# Patient Record
Sex: Female | Born: 1950 | Race: White | Hispanic: No | Marital: Married | Smoking: Never smoker
Health system: Southern US, Community
[De-identification: ages and names within clinical notes are randomized; demographics above are authoritative.]

## PROBLEM LIST (undated history)

## (undated) DIAGNOSIS — M818 Other osteoporosis without current pathological fracture: Secondary | ICD-10-CM

## (undated) DIAGNOSIS — J189 Pneumonia, unspecified organism: Secondary | ICD-10-CM

## (undated) DIAGNOSIS — K219 Gastro-esophageal reflux disease without esophagitis: Secondary | ICD-10-CM

## (undated) DIAGNOSIS — E1169 Type 2 diabetes mellitus with other specified complication: Secondary | ICD-10-CM

## (undated) DIAGNOSIS — E781 Pure hyperglyceridemia: Secondary | ICD-10-CM

## (undated) DIAGNOSIS — J45909 Unspecified asthma, uncomplicated: Secondary | ICD-10-CM

## (undated) DIAGNOSIS — I10 Essential (primary) hypertension: Secondary | ICD-10-CM

## (undated) DIAGNOSIS — C801 Malignant (primary) neoplasm, unspecified: Secondary | ICD-10-CM

## (undated) DIAGNOSIS — K5790 Diverticulosis of intestine, part unspecified, without perforation or abscess without bleeding: Secondary | ICD-10-CM

## (undated) HISTORY — PX: ESOPHAGOGASTRODUODENOSCOPY: SHX1529

## (undated) HISTORY — PX: TONSILLECTOMY AND ADENOIDECTOMY: SHX28

## (undated) HISTORY — PX: ABDOMINAL HYSTERECTOMY: SHX81

## (undated) HISTORY — PX: TONSILLECTOMY: SUR1361

## (undated) HISTORY — PX: COLONOSCOPY: SHX174

## (undated) HISTORY — PX: KNEE ARTHROSCOPY: SHX127

## (undated) HISTORY — PX: OTHER SURGICAL HISTORY: SHX169

---

## 1978-02-12 HISTORY — PX: BREAST BIOPSY: SHX20

## 1978-02-12 HISTORY — PX: BREAST SURGERY: SHX581

## 2003-11-19 ENCOUNTER — Encounter: Payer: Self-pay | Admitting: Internal Medicine

## 2003-12-14 ENCOUNTER — Encounter: Payer: Self-pay | Admitting: Internal Medicine

## 2004-01-13 ENCOUNTER — Encounter: Payer: Self-pay | Admitting: Internal Medicine

## 2004-02-13 ENCOUNTER — Encounter: Payer: Self-pay | Admitting: Internal Medicine

## 2004-03-08 ENCOUNTER — Ambulatory Visit: Payer: Self-pay

## 2004-03-15 ENCOUNTER — Encounter: Payer: Self-pay | Admitting: Internal Medicine

## 2004-04-12 ENCOUNTER — Encounter: Payer: Self-pay | Admitting: Internal Medicine

## 2004-05-13 ENCOUNTER — Encounter: Payer: Self-pay | Admitting: Internal Medicine

## 2004-06-12 ENCOUNTER — Encounter: Payer: Self-pay | Admitting: Internal Medicine

## 2004-07-03 ENCOUNTER — Ambulatory Visit: Payer: Self-pay | Admitting: Internal Medicine

## 2004-07-13 ENCOUNTER — Encounter: Payer: Self-pay | Admitting: Internal Medicine

## 2004-07-13 ENCOUNTER — Ambulatory Visit: Payer: Self-pay | Admitting: Internal Medicine

## 2004-08-12 ENCOUNTER — Ambulatory Visit: Payer: Self-pay | Admitting: Internal Medicine

## 2004-09-12 ENCOUNTER — Ambulatory Visit: Payer: Self-pay | Admitting: Internal Medicine

## 2004-12-26 ENCOUNTER — Ambulatory Visit: Payer: Self-pay

## 2005-05-30 ENCOUNTER — Inpatient Hospital Stay: Payer: Self-pay | Admitting: Internal Medicine

## 2005-05-30 ENCOUNTER — Other Ambulatory Visit: Payer: Self-pay

## 2005-06-08 ENCOUNTER — Ambulatory Visit: Payer: Self-pay

## 2006-06-11 ENCOUNTER — Ambulatory Visit: Payer: Self-pay

## 2007-06-26 ENCOUNTER — Ambulatory Visit: Payer: Self-pay

## 2007-09-16 ENCOUNTER — Ambulatory Visit: Payer: Self-pay

## 2007-10-01 ENCOUNTER — Ambulatory Visit: Payer: Self-pay | Admitting: Gastroenterology

## 2007-10-10 ENCOUNTER — Ambulatory Visit: Payer: Self-pay | Admitting: Gastroenterology

## 2007-11-12 ENCOUNTER — Ambulatory Visit: Payer: Self-pay | Admitting: Gastroenterology

## 2007-11-24 ENCOUNTER — Ambulatory Visit: Payer: Self-pay | Admitting: Nurse Practitioner

## 2008-01-12 ENCOUNTER — Ambulatory Visit: Payer: Self-pay | Admitting: Gastroenterology

## 2008-05-03 ENCOUNTER — Ambulatory Visit: Payer: Self-pay | Admitting: Cardiology

## 2008-05-11 ENCOUNTER — Ambulatory Visit: Payer: Self-pay | Admitting: Cardiology

## 2008-07-13 ENCOUNTER — Ambulatory Visit: Payer: Self-pay

## 2009-03-09 ENCOUNTER — Ambulatory Visit: Payer: Self-pay | Admitting: Unknown Physician Specialty

## 2009-04-05 IMAGING — US ABDOMEN ULTRASOUND
1 series · 17 of 25 positions shown · non-contrast
Comparison: none

REASON FOR EXAM: Generalized abdominal pain
COMMENTS:

[Series 1: abdomen ultrasound · 17 of 57 slices shown]
[im 1/57]
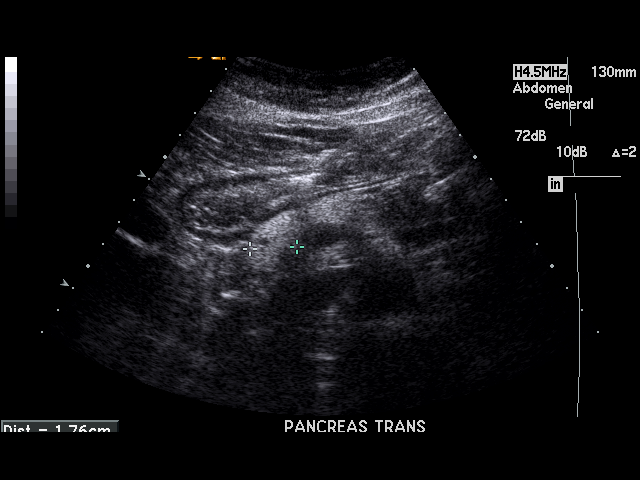
[im 5/57]
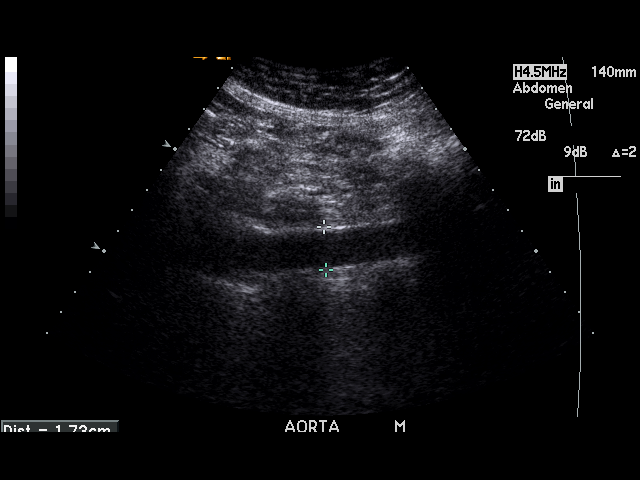
[im 8/57]
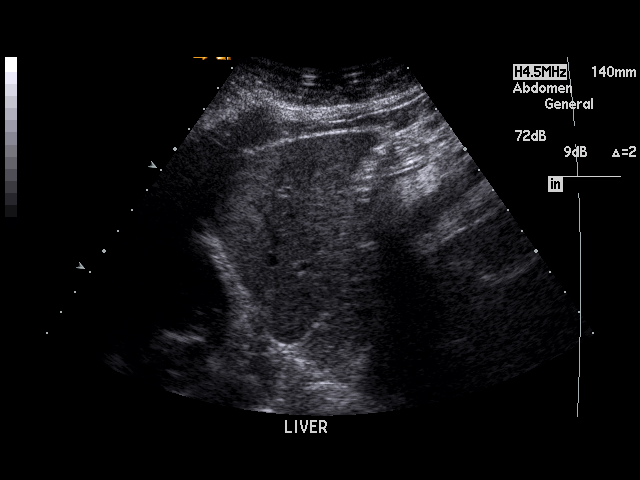
[im 12/57]
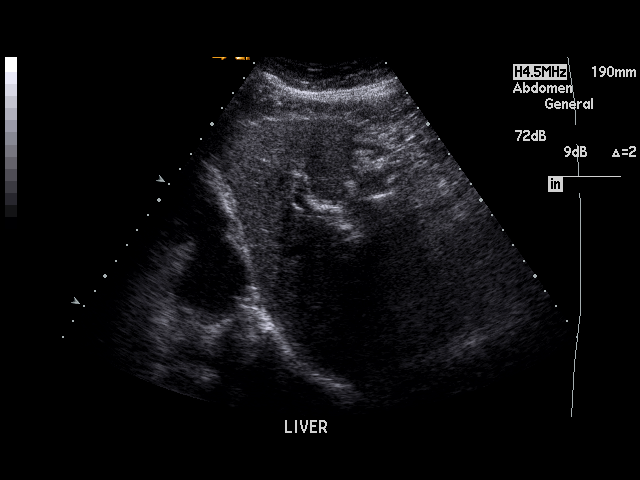
[im 15/57]
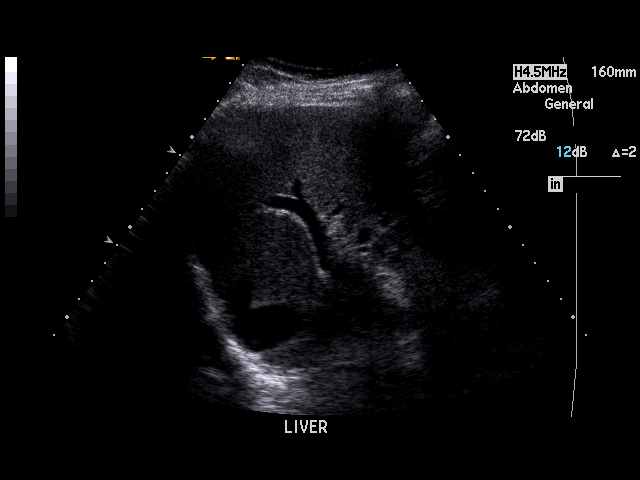
[im 19/57]
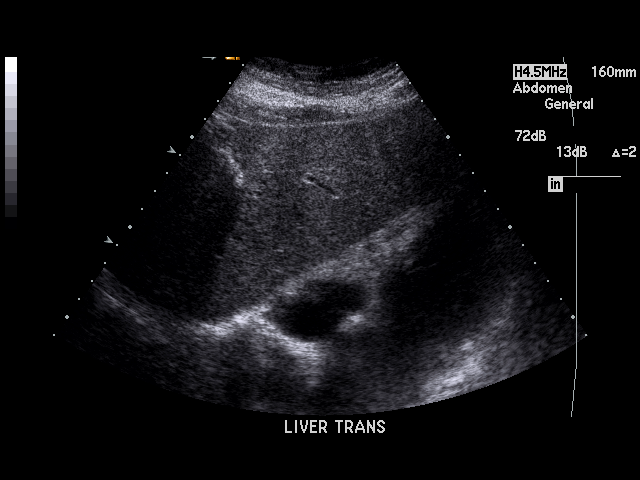
[im 22/57]
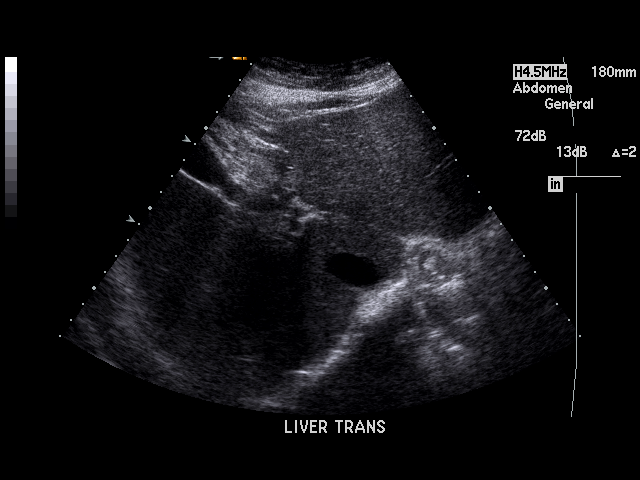
[im 26/57]
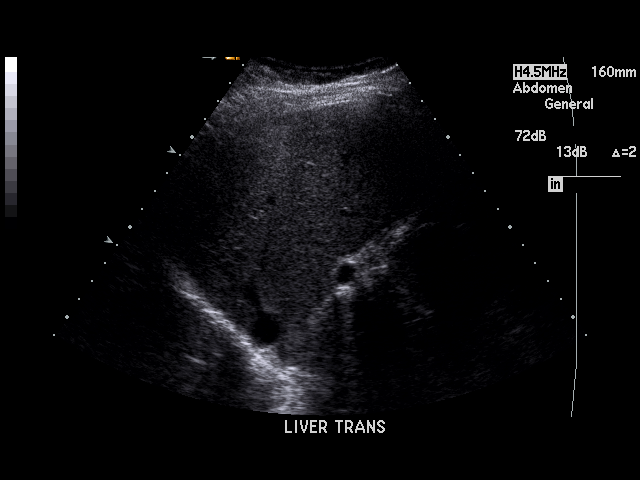
[im 29/57]
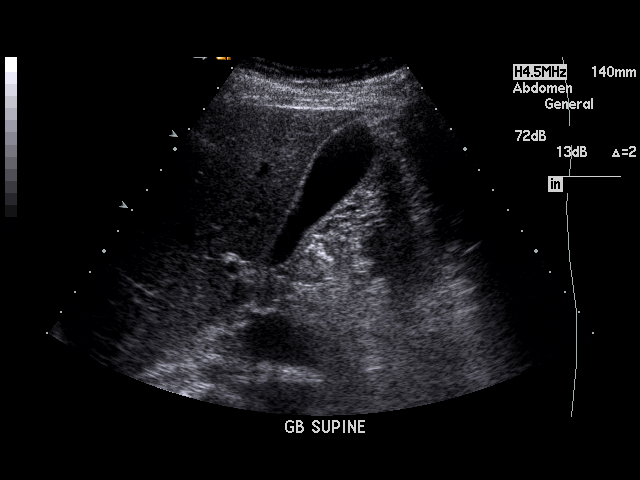
[im 31/57]
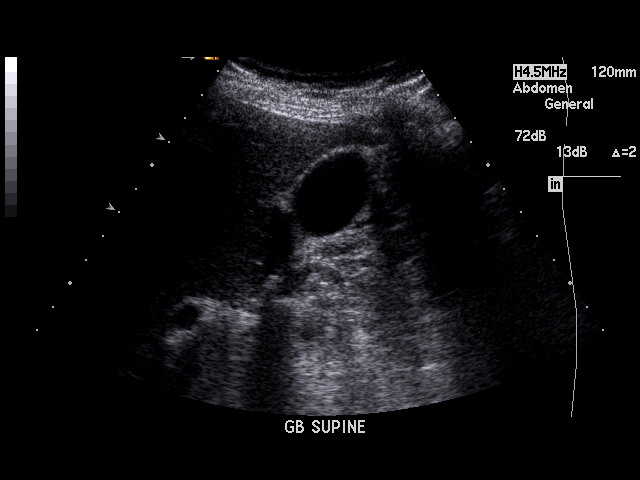
[im 36/57]
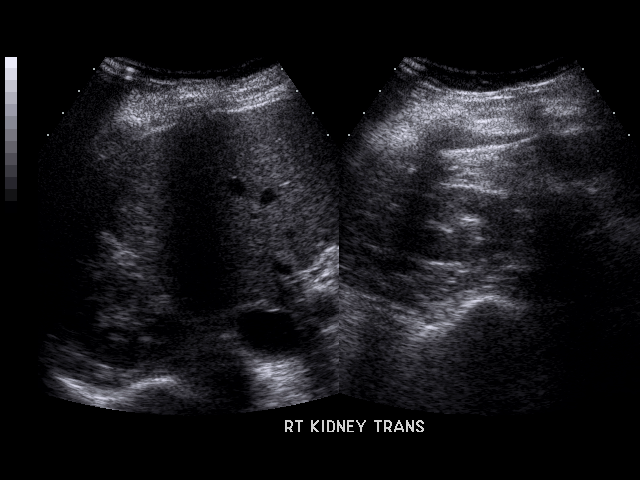
[im 38/57]
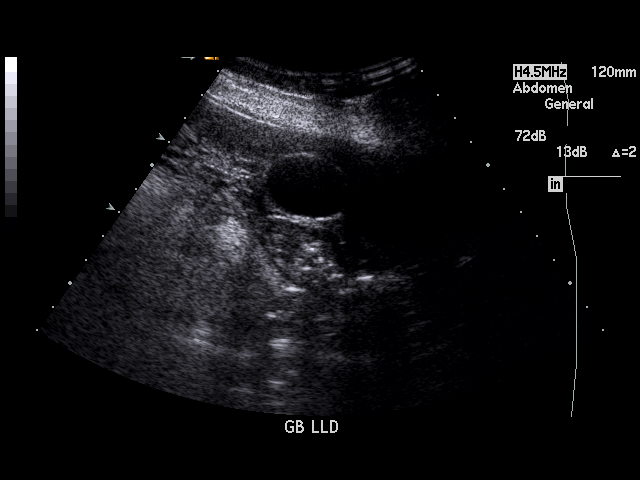
[im 43/57]
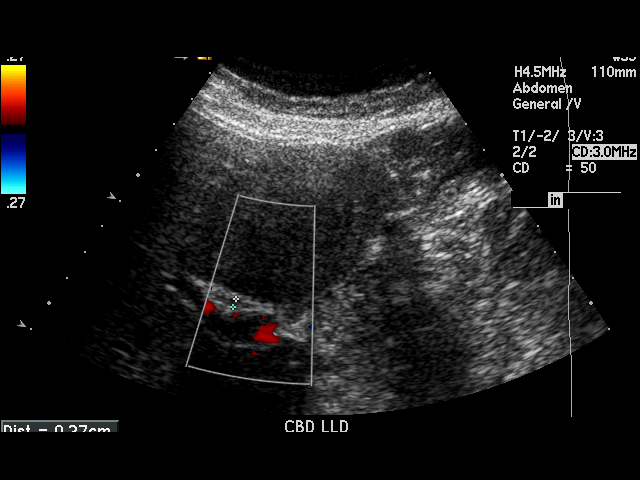
[im 45/57]
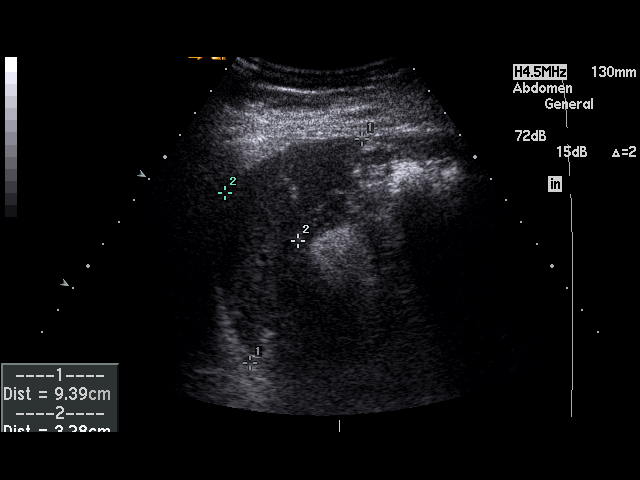
[im 50/57]
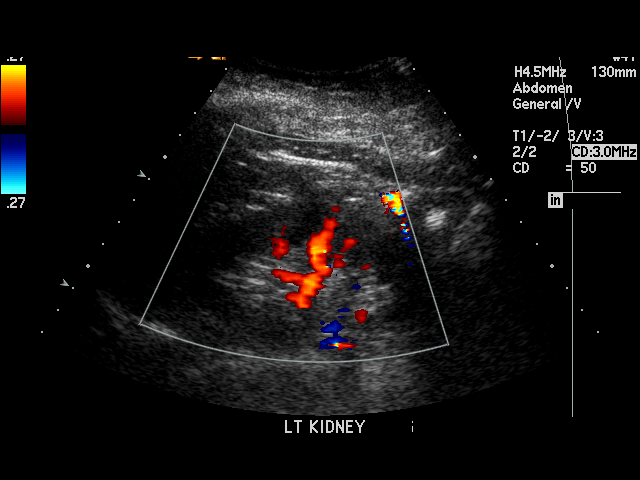
[im 52/57]
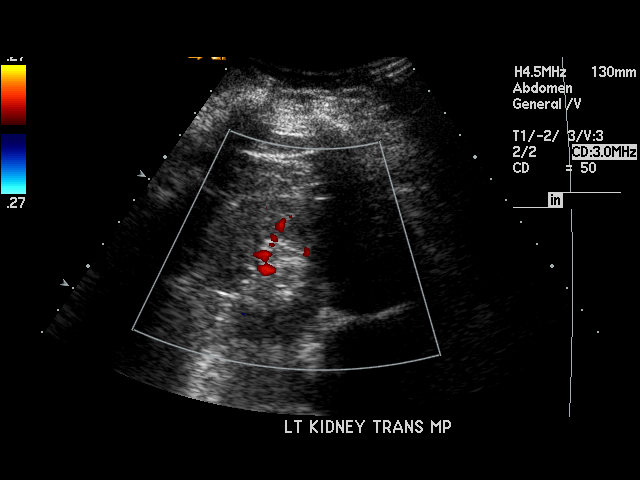
[im 57/57]
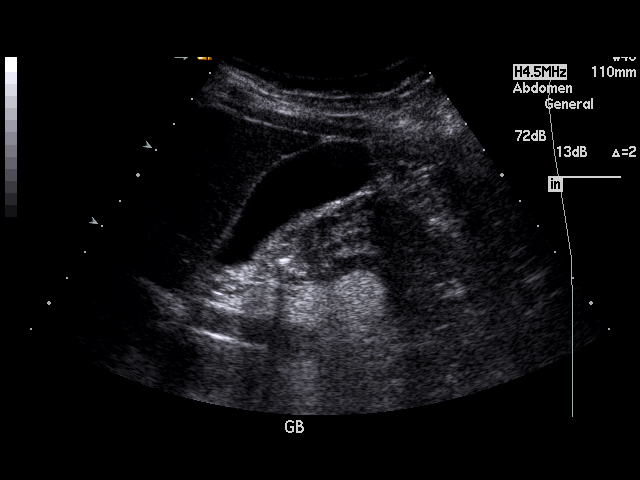

[17 of 25 positions shown; findings below may reference images not displayed]

PROCEDURE:     US  - US ABDOMEN GENERAL SURVEY  - October 01, 2007  [DATE]

RESULT:     The liver, spleen, abdominal aorta and inferior vena cava are
normal in appearance. The head and body of the pancreas are visualized and
show no significant abnormalities. The pancreatic tail is obscured by bowel
gas. No gallstones are seen. There is no thickening of the gallbladder wall.
The common bile duct measures 2.7 mm in diameter which is within normal
limits. The kidneys show no hydronephrosis. There is no ascites.
IMPRESSION: 1.  No significant abnormalities are noted.
2.  No gallstones are seen.
3.  The pancreatic tail is obscured on this exam.

## 2009-06-17 ENCOUNTER — Ambulatory Visit: Payer: Self-pay | Admitting: Gastroenterology

## 2009-07-15 ENCOUNTER — Ambulatory Visit: Payer: Self-pay | Admitting: Specialist

## 2009-08-17 ENCOUNTER — Ambulatory Visit: Payer: Self-pay | Admitting: Specialist

## 2010-01-16 ENCOUNTER — Ambulatory Visit: Payer: Self-pay

## 2010-01-20 ENCOUNTER — Ambulatory Visit: Payer: Self-pay | Admitting: Unknown Physician Specialty

## 2010-04-11 ENCOUNTER — Emergency Department: Payer: Self-pay

## 2010-04-12 ENCOUNTER — Ambulatory Visit: Payer: Self-pay | Admitting: Cardiology

## 2012-05-26 ENCOUNTER — Ambulatory Visit: Payer: Self-pay | Admitting: Family Medicine

## 2012-06-26 ENCOUNTER — Ambulatory Visit: Payer: Self-pay | Admitting: Family Medicine

## 2014-03-23 ENCOUNTER — Ambulatory Visit: Payer: Self-pay | Admitting: Gastroenterology

## 2014-06-07 LAB — SURGICAL PATHOLOGY

## 2014-07-29 ENCOUNTER — Ambulatory Visit: Payer: Self-pay | Admitting: Cardiovascular Disease

## 2014-08-06 ENCOUNTER — Ambulatory Visit: Payer: Self-pay | Admitting: Family Medicine

## 2017-06-07 ENCOUNTER — Other Ambulatory Visit: Payer: Self-pay | Admitting: Student

## 2017-06-07 DIAGNOSIS — G8929 Other chronic pain: Secondary | ICD-10-CM

## 2017-06-07 DIAGNOSIS — M546 Pain in thoracic spine: Principal | ICD-10-CM

## 2017-06-12 ENCOUNTER — Ambulatory Visit: Payer: Medicare HMO

## 2017-06-19 ENCOUNTER — Ambulatory Visit
Admission: RE | Admit: 2017-06-19 | Discharge: 2017-06-19 | Disposition: A | Discharge status: Home / Self Care | Payer: Medicare HMO | Source: Ambulatory Visit | Attending: Student | Admitting: Student

## 2017-06-19 DIAGNOSIS — G8929 Other chronic pain: Secondary | ICD-10-CM | POA: Insufficient documentation

## 2017-06-19 DIAGNOSIS — M546 Pain in thoracic spine: Secondary | ICD-10-CM | POA: Diagnosis present

## 2019-01-19 ENCOUNTER — Other Ambulatory Visit: Payer: Self-pay

## 2019-01-19 DIAGNOSIS — Z20822 Contact with and (suspected) exposure to covid-19: Secondary | ICD-10-CM

## 2019-01-21 LAB — NOVEL CORONAVIRUS, NAA: SARS-CoV-2, NAA: NOT DETECTED

## 2019-07-27 ENCOUNTER — Other Ambulatory Visit: Payer: Self-pay | Admitting: Internal Medicine

## 2019-07-27 DIAGNOSIS — R0602 Shortness of breath: Secondary | ICD-10-CM

## 2019-07-27 DIAGNOSIS — Z8616 Personal history of COVID-19: Secondary | ICD-10-CM

## 2019-07-29 ENCOUNTER — Ambulatory Visit
Admission: RE | Admit: 2019-07-29 | Discharge: 2019-07-29 | Disposition: A | Discharge status: Home / Self Care | Payer: Medicare Other | Source: Ambulatory Visit | Attending: Internal Medicine | Admitting: Internal Medicine

## 2019-07-29 ENCOUNTER — Other Ambulatory Visit: Payer: Self-pay

## 2019-07-29 DIAGNOSIS — R0602 Shortness of breath: Secondary | ICD-10-CM | POA: Insufficient documentation

## 2019-07-29 DIAGNOSIS — Z8616 Personal history of COVID-19: Secondary | ICD-10-CM | POA: Insufficient documentation

## 2019-07-29 HISTORY — DX: Malignant (primary) neoplasm, unspecified: C80.1

## 2019-07-29 HISTORY — DX: Unspecified asthma, uncomplicated: J45.909

## 2019-07-29 LAB — POCT I-STAT CREATININE: Creatinine, Ser: 0.9 mg/dL (ref 0.44–1.00)

## 2019-07-29 MED ORDER — IOHEXOL 300 MG/ML  SOLN
75.0000 mL | Freq: Once | INTRAMUSCULAR | Status: AC | PRN
  Administered 2019-07-29: 75 mL via INTRAVENOUS

## 2019-08-06 ENCOUNTER — Ambulatory Visit: Payer: Medicare HMO

## 2021-01-18 ENCOUNTER — Other Ambulatory Visit: Payer: Self-pay | Admitting: Internal Medicine

## 2021-01-18 DIAGNOSIS — Z1231 Encounter for screening mammogram for malignant neoplasm of breast: Secondary | ICD-10-CM

## 2021-01-31 IMAGING — CT CT CHEST W/ CM
2 of 3 series · 15 of 36 positions shown, 18 images · IV contrast (omnipaque)
Comparison: None.

CLINICAL DATA: Shortness of breath since EL0J6-Q5 infection
January 2019.

EXAM:
CT CHEST WITH CONTRAST
TECHNIQUE: Multidetector CT imaging of the chest was performed during
intravenous contrast administration.
CONTRAST:  75mL OMNIPAQUE IOHEXOL 300 MG/ML  SOLN

[Series 2: axial st · axial · 0.63mm/px · z∈[-299,-55]mm · 12 of 144 slices shown, 15 images]
[im 11/144  mediastinal]
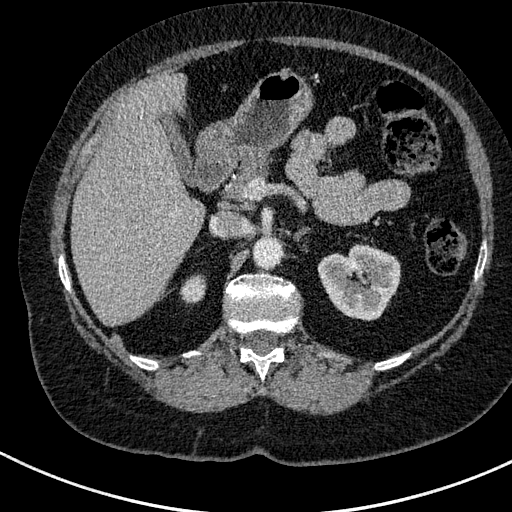
[im 11/144  lung]
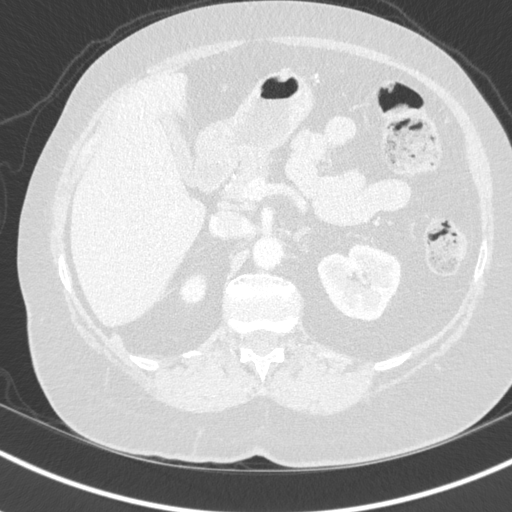
[im 22/144  lung]
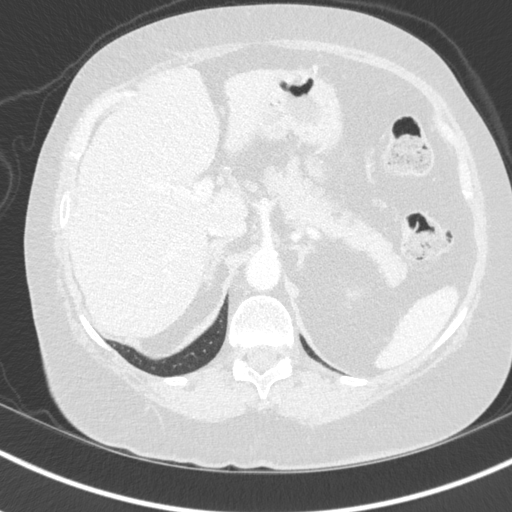
[im 32/144  lung]
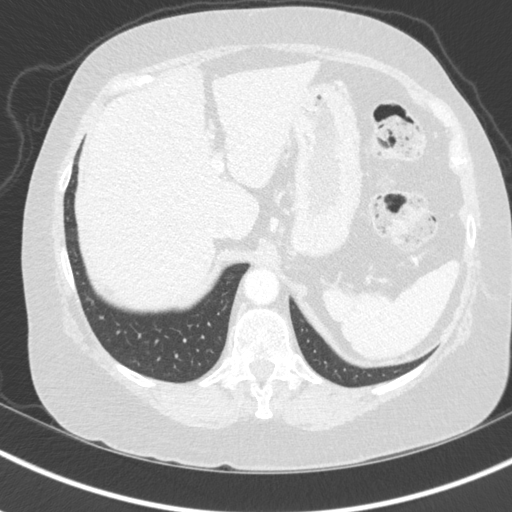
[im 43/144  lung]
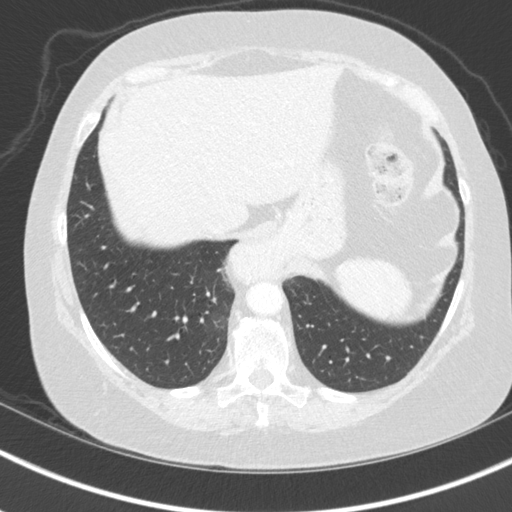
[im 53/144  mediastinal]
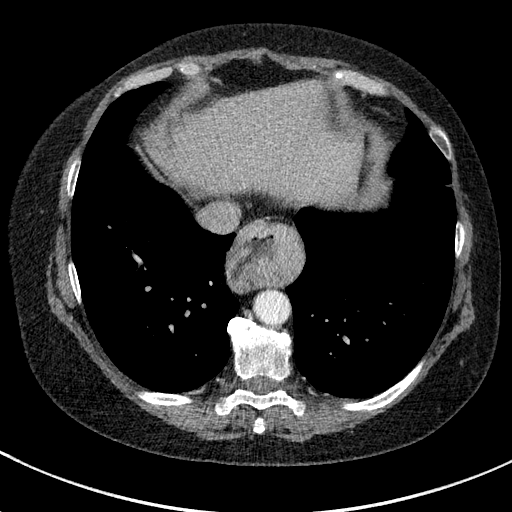
[im 53/144  lung]
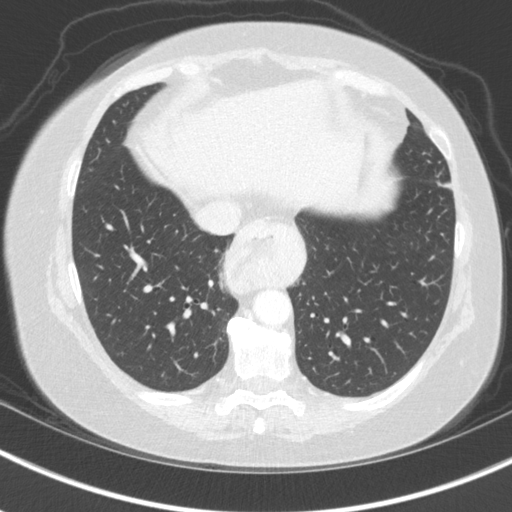
[im 64/144  lung]
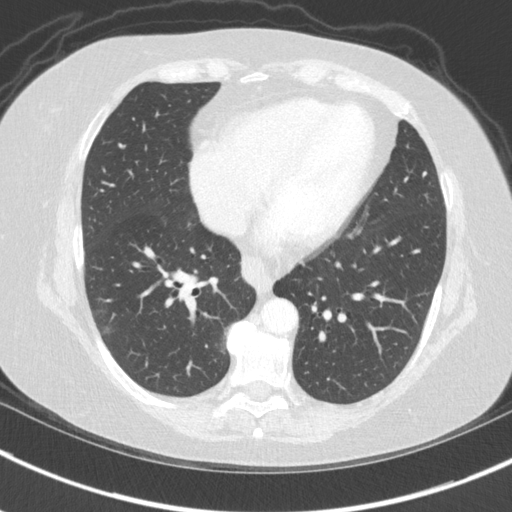
[im 80/144  lung]
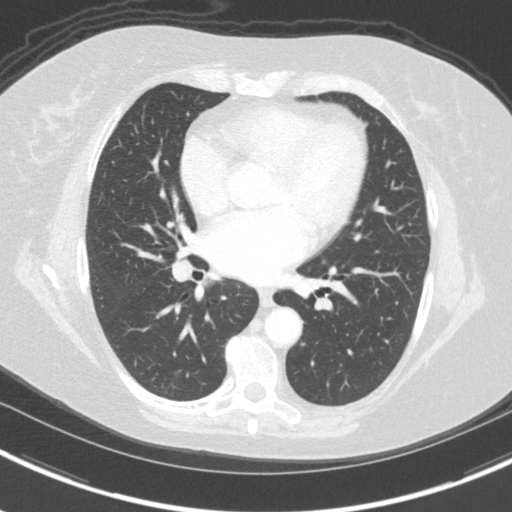
[im 91/144  lung]
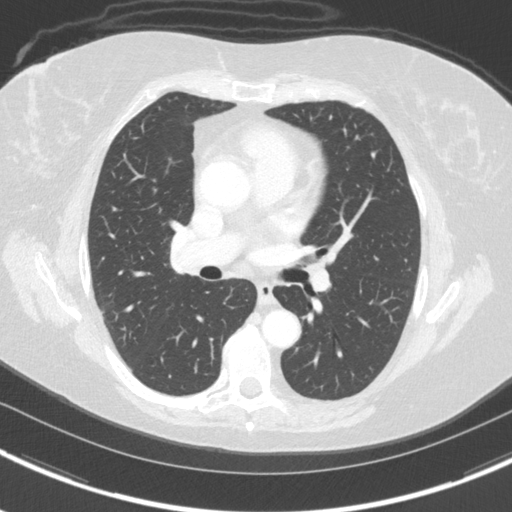
[im 101/144  mediastinal]
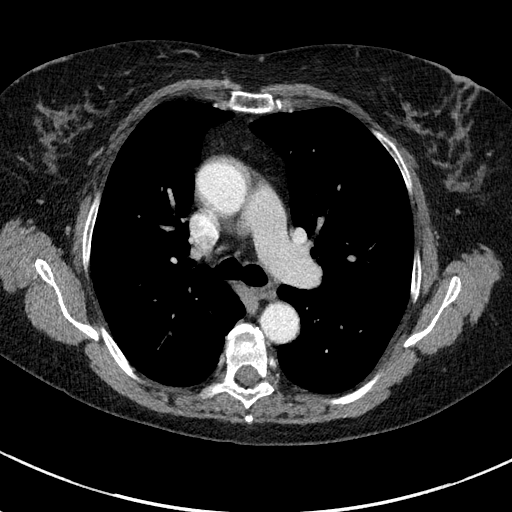
[im 101/144  lung]
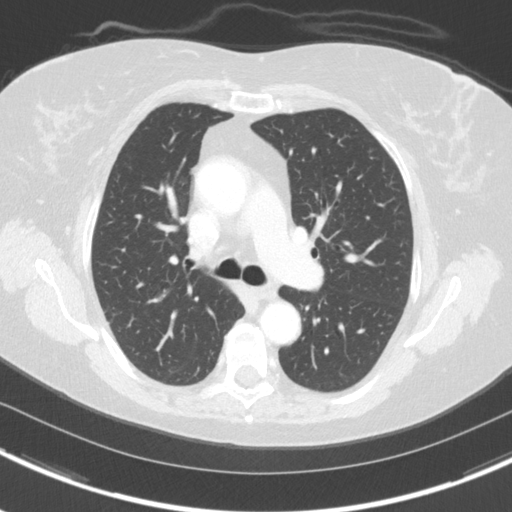
[im 112/144  lung]
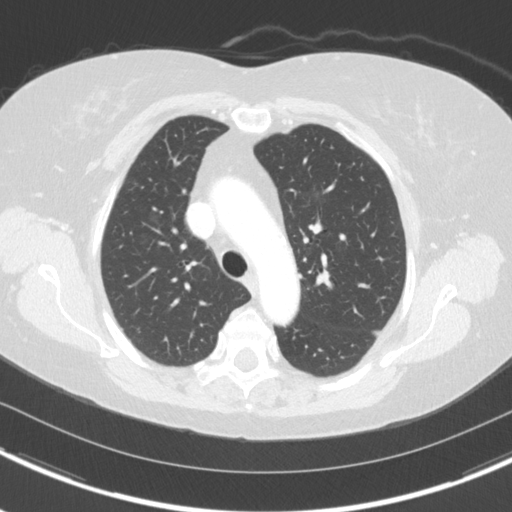
[im 122/144  lung]
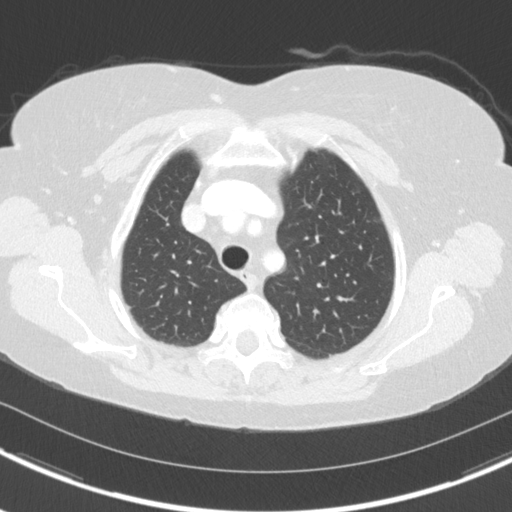
[im 133/144  lung]
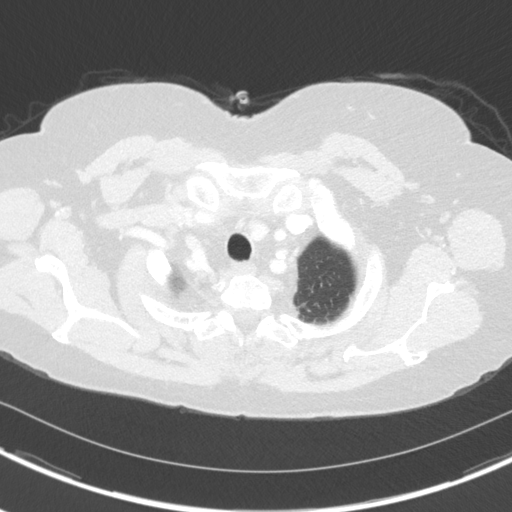

[Series 5: coronal · coronal · 0.60mm/px · 3 of 135 slices shown]
[im 27/135  lung]
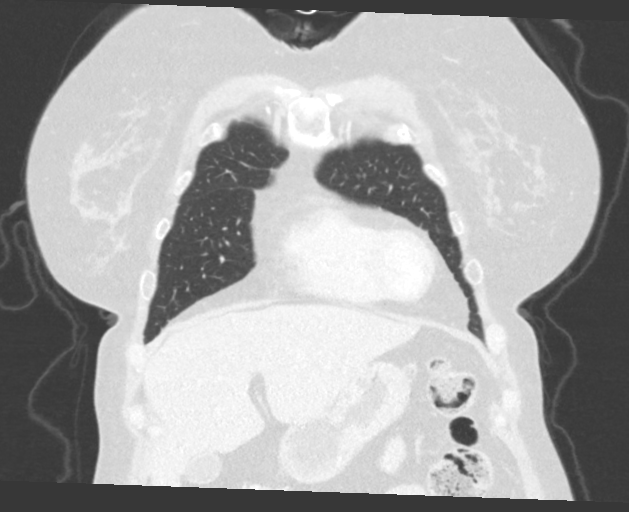
[im 54/135  lung]
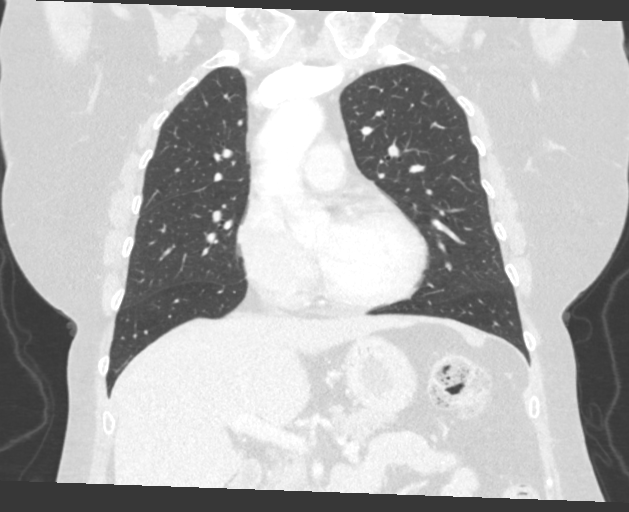
[im 81/135  lung]
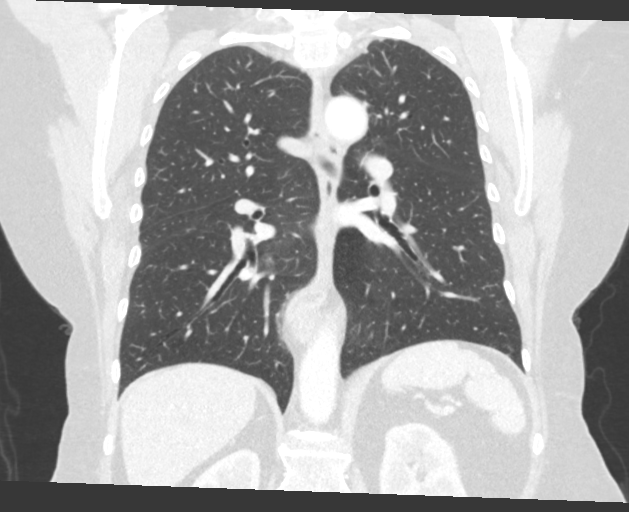

[15 of 36 positions shown; findings below may reference images not displayed]

FINDINGS: Cardiovascular: Mild aortic atherosclerosis. Mild aortic tortuosity
without aneurysm. Conventional branching pattern from the aortic
arch. No evidence of acute aortic abnormality. The heart is normal
in size. No pericardial effusion.

Mediastinum/Nodes: Small to moderate hiatal hernia. No esophageal
wall thickening. No visualized thyroid nodule. There are no enlarged
mediastinal or hilar lymph nodes. No axillary adenopathy.

Lungs/Pleura: The lungs are clear. No consolidation or focal
airspace disease. No ground-glass opacity or pulmonary fibrosis. No
evidence of sequela of EL0J6-Q5 pneumonia. Minor subpleural
atelectasis in the lingula. No pulmonary mass. Trace retained mucus
in the trachea. No pleural fluid. No pulmonary edema.

Upper Abdomen: No acute or unexpected findings. Small to moderate
hiatal hernia.

Musculoskeletal: There are no acute or suspicious osseous
abnormalities.
IMPRESSION: 1. No acute intrathoracic abnormality. No explanation for shortness
of breath or sequela of EL0J6-Q5.
2. Small to moderate hiatal hernia.

Aortic Atherosclerosis ([id number]).

## 2022-01-11 ENCOUNTER — Other Ambulatory Visit: Payer: Self-pay

## 2022-01-11 DIAGNOSIS — M222X1 Patellofemoral disorders, right knee: Secondary | ICD-10-CM

## 2022-01-16 ENCOUNTER — Other Ambulatory Visit: Payer: Self-pay | Admitting: Orthopedic Surgery

## 2022-01-16 DIAGNOSIS — M222X1 Patellofemoral disorders, right knee: Secondary | ICD-10-CM

## 2022-01-22 ENCOUNTER — Other Ambulatory Visit: Payer: Medicare Other

## 2022-01-31 ENCOUNTER — Ambulatory Visit
Admission: RE | Admit: 2022-01-31 | Discharge: 2022-01-31 | Disposition: A | Discharge status: Home / Self Care | Payer: Medicare Other | Source: Ambulatory Visit | Attending: Orthopedic Surgery | Admitting: Orthopedic Surgery

## 2022-01-31 DIAGNOSIS — M222X1 Patellofemoral disorders, right knee: Secondary | ICD-10-CM

## 2022-02-19 ENCOUNTER — Other Ambulatory Visit: Payer: Self-pay | Admitting: Surgery

## 2022-03-07 ENCOUNTER — Encounter
Admission: RE | Admit: 2022-03-07 | Discharge: 2022-03-07 | Disposition: A | Discharge status: Home / Self Care | Payer: Medicare Other | Source: Ambulatory Visit | Attending: Surgery | Admitting: Surgery

## 2022-03-07 VITALS — Ht 61.0 in | Wt 185.0 lb

## 2022-03-07 DIAGNOSIS — I10 Essential (primary) hypertension: Secondary | ICD-10-CM

## 2022-03-07 DIAGNOSIS — E119 Type 2 diabetes mellitus without complications: Secondary | ICD-10-CM

## 2022-03-07 HISTORY — DX: Other osteoporosis without current pathological fracture: M81.8

## 2022-03-07 HISTORY — DX: Diverticulosis of intestine, part unspecified, without perforation or abscess without bleeding: K57.90

## 2022-03-07 HISTORY — DX: Morbid (severe) obesity due to excess calories: E66.01

## 2022-03-07 HISTORY — DX: Type 2 diabetes mellitus with other specified complication: E11.69

## 2022-03-07 HISTORY — DX: Gastro-esophageal reflux disease without esophagitis: K21.9

## 2022-03-07 HISTORY — DX: Pure hyperglyceridemia: E78.1

## 2022-03-07 HISTORY — DX: Pneumonia, unspecified organism: J18.9

## 2022-03-07 HISTORY — DX: Essential (primary) hypertension: I10

## 2022-03-07 NOTE — Patient Instructions (Addendum)
Your procedure is scheduled on: Thursday March 22, 2022. Report to Day Surgery inside [facility] 2nd floor, stop by registration desk before getting on elevator.  To find out your arrival time please call [phone] between 1PM - 3PM on Wednesday March 21, 2022.  Remember: Instructions that are not followed completely may result in serious medical risk,  up to and including death, or upon the discretion of your surgeon and anesthesiologist your  surgery may need to be rescheduled.     _X__ 1. Do not eat food after midnight the night before your procedure.                 No chewing gum or hard candies. You may drink clear liquids up to 2 hours                 before you are scheduled to arrive for your surgery- DO not drink clear                 liquids within 2 hours of the start of your surgery.                 Clear Liquids include:  water.  __X__2.   Complete the "Ensure Clear Pre-surgery Clear Carbohydrate Drink" provided to you, 2 hours before arrival. **If you are diabetic you will be provided with an alternative drink, Gatorade Zero or G2.  __X__3.  On the morning of surgery brush your teeth with toothpaste and water, you                may rinse your mouth with mouthwash if you wish.  Do not swallow any toothpaste or mouthwash.     _X__ 4.  No Alcohol for 24 hours before or after surgery.   _X__ 5.  Do Not Smoke or use e-cigarettes For 24 Hours Prior to Your Surgery.                 Do not use any chewable tobacco products for at least 6 hours prior to                 Surgery.  _X__  6.  Do not use any recreational drugs (marijuana, cocaine, heroin, ecstasy, MDMA or other)                For at least one week prior to your surgery.  Combination of these drugs with anesthesia                May have life threatening results.  ____  7.  Bring all medications with you on the day of surgery if instructed.   __X__8.  Notify your doctor if there is  any change in your medical condition      (cold, fever, infections).     Do not wear jewelry, make-up, hairpins, clips or nail polish. Do not wear lotions, powders, or perfumes. You may wear deodorant. Do not shave 48 hours prior to surgery. Men may shave face and neck. Do not bring valuables to the hospital.    [facility] is not responsible for any belongings or valuables.  Contacts, dentures or bridgework may not be worn into surgery. Leave your suitcase in the car. After surgery it may be brought to your room. For patients admitted to the hospital, discharge time is determined by your treatment team.   Patients discharged the day of surgery will not be allowed to drive home.   Make arrangements  for someone to be with you for the first 24 hours of your Same Day Discharge.   __X__ Take these medicines the morning of surgery with A SIP OF WATER:    1. esomeprazole (NEXIUM) 20 MG  2.   3.   4.  5.  6.  ____ Fleet Enema (as directed)   __X__ Use CHG Soap (or wipes) as directed  ____ Use Benzoyl Peroxide Gel as instructed  ____ Use inhalers on the day of surgery  ____ Stop metformin 2 days prior to surgery    __X__ Stop aspirin 81 mg 7 days prior to surgery.   __X__ One Week prior to surgery- Stop Anti-inflammatories such as Ibuprofen, Aleve, Advil, Motrin, meloxicam (MOBIC), diclofenac, etodolac, ketorolac, Toradol, Daypro, piroxicam, Goody's or BC powders. OK TO USE TYLENOL IF NEEDED   __X__ One week prior to surgery stop ALL vitamins and or supplements until after surgery. ascorbic acid (VITAMIN C), Vitamin E, DOCOSAHEXAENOIC ACID (fish oil), Cholecalciferol (vitamin D)    ____ Bring C-Pap to the hospital.    If you have any questions regarding your pre-procedure instructions,  Please call Pre-admit Testing at [phone]    Preparing for Surgery with CHLORHEXIDINE GLUCONATE (CHG) Soap  Chlorhexidine Gluconate (CHG) Soap  o An antiseptic cleaner that kills  germs and bonds with the skin to continue killing germs even after washing  o Used for showering the night before surgery and morning of surgery  Before surgery, you can play an important role by reducing the number of germs on your skin.  CHG (Chlorhexidine gluconate) soap is an antiseptic cleanser which kills germs and bonds with the skin to continue killing germs even after washing.  Please do not use if you have an allergy to CHG or antibacterial soaps. If your skin becomes reddened/irritated stop using the CHG.  1. Shower the NIGHT BEFORE SURGERY and the MORNING OF SURGERY with CHG soap.  2. If you choose to wash your hair, wash your hair first as usual with your normal shampoo.  3. After shampooing, rinse your hair and body thoroughly to remove the shampoo.  4. Use CHG as you would any other liquid soap. You can apply CHG directly to the skin and wash gently with a scrungie or a clean washcloth.  5. Apply the CHG soap to your body only from the neck down. Do not use on open wounds or open sores. Avoid contact with your eyes, ears, mouth, and genitals (private parts). Wash face and genitals (private parts) with your normal soap.  6. Wash thoroughly, paying special attention to the area where your surgery will be performed.  7. Thoroughly rinse your body with warm water.  8. Do not shower/wash with your normal soap after using and rinsing off the CHG soap.  9. Pat yourself dry with a clean towel.  10. Wear clean pajamas to bed the night before surgery.  12. Place clean sheets on your bed the night of your first shower and do not sleep with pets.  13. Shower again with the CHG soap on the day of surgery prior to arriving at the hospital.  14. Do not apply any deodorants/lotions/powders.  15. Please wear clean clothes to the hospital.

## 2022-03-12 ENCOUNTER — Encounter
Admission: RE | Admit: 2022-03-12 | Discharge: 2022-03-12 | Disposition: A | Discharge status: Home / Self Care | Payer: Medicare Other | Source: Ambulatory Visit | Attending: Surgery | Admitting: Surgery

## 2022-03-12 DIAGNOSIS — I10 Essential (primary) hypertension: Secondary | ICD-10-CM | POA: Diagnosis not present

## 2022-03-12 DIAGNOSIS — Z01818 Encounter for other preprocedural examination: Secondary | ICD-10-CM | POA: Diagnosis present

## 2022-03-12 DIAGNOSIS — E119 Type 2 diabetes mellitus without complications: Secondary | ICD-10-CM

## 2022-03-12 DIAGNOSIS — Z01810 Encounter for preprocedural cardiovascular examination: Secondary | ICD-10-CM | POA: Diagnosis not present

## 2022-03-12 LAB — CBC
HCT: 41.4 % (ref 36.0–46.0)
Hemoglobin: 13.9 g/dL (ref 12.0–15.0)
MCH: 29.7 pg (ref 26.0–34.0)
MCHC: 33.6 g/dL (ref 30.0–36.0)
MCV: 88.5 fL (ref 80.0–100.0)
Platelets: 251 10*3/uL (ref 150–400)
RBC: 4.68 MIL/uL (ref 3.87–5.11)
RDW: 13.2 % (ref 11.5–15.5)
WBC: 7 10*3/uL (ref 4.0–10.5)
nRBC: 0 % (ref 0.0–0.2)

## 2022-03-12 LAB — BASIC METABOLIC PANEL
Anion gap: 9 (ref 5–15)
BUN: 22 mg/dL (ref 8–23)
CO2: 27 mmol/L (ref 22–32)
Calcium: 9.3 mg/dL (ref 8.9–10.3)
Chloride: 99 mmol/L (ref 98–111)
Creatinine, Ser: 0.86 mg/dL (ref 0.44–1.00)
GFR, Estimated: >60 mL/min (ref >60)
Glucose, Bld: 115 mg/dL — ABNORMAL HIGH (ref 70–99)
Potassium: 3.8 mmol/L (ref 3.5–5.1)
Sodium: 135 mmol/L (ref 135–145)

## 2022-03-22 ENCOUNTER — Encounter: Admission: RE | Payer: Self-pay | Source: Home / Self Care

## 2022-03-22 ENCOUNTER — Ambulatory Visit: Admission: RE | Admit: 2022-03-22 | Payer: Medicare Other | Source: Home / Self Care | Admitting: Surgery

## 2022-03-22 SURGERY — ARTHROSCOPY, KNEE
Anesthesia: Choice | Site: Knee | Laterality: Right

## 2022-04-03 ENCOUNTER — Other Ambulatory Visit: Payer: Self-pay | Admitting: Surgery

## 2022-04-06 ENCOUNTER — Other Ambulatory Visit: Payer: Medicare Other

## 2022-04-06 ENCOUNTER — Encounter
Admission: RE | Admit: 2022-04-06 | Discharge: 2022-04-06 | Disposition: A | Discharge status: Home / Self Care | Payer: Medicare Other | Source: Ambulatory Visit | Attending: Surgery | Admitting: Surgery

## 2022-04-06 VITALS — Ht 61.0 in | Wt 183.0 lb

## 2022-04-06 DIAGNOSIS — E119 Type 2 diabetes mellitus without complications: Secondary | ICD-10-CM

## 2022-04-06 DIAGNOSIS — Z01812 Encounter for preprocedural laboratory examination: Secondary | ICD-10-CM | POA: Diagnosis present

## 2022-04-06 LAB — URINALYSIS, ROUTINE W REFLEX MICROSCOPIC
Bilirubin Urine: NEGATIVE
Glucose, UA: NEGATIVE mg/dL
Hgb urine dipstick: NEGATIVE
Ketones, ur: NEGATIVE mg/dL
Nitrite: NEGATIVE
Protein, ur: NEGATIVE mg/dL
Specific Gravity, Urine: 1.018 (ref 1.005–1.030)
pH: 5 (ref 5.0–8.0)

## 2022-04-06 LAB — TYPE AND SCREEN
ABO/RH(D): A POS
Antibody Screen: NEGATIVE

## 2022-04-06 LAB — SURGICAL PCR SCREEN
MRSA, PCR: NEGATIVE
Staphylococcus aureus: NEGATIVE

## 2022-04-06 NOTE — Patient Instructions (Addendum)
Your procedure is scheduled on: Tuesday, March 5 Report to the Registration Desk on the 1st floor of the [facility]. To find out your arrival time, please call [phone] between 1PM - 3PM on: Monday, March 4 If your arrival time is 6:00 am, do not arrive before that time as the [facility] entrance doors do not open until 6:00 am.  REMEMBER: Instructions that are not followed completely may result in serious medical risk, up to and including death; or upon the discretion of your surgeon and anesthesiologist your surgery may need to be rescheduled.  Do not eat food after midnight the night before surgery.  No gum chewing or hard candies.  You may however, drink CLEAR liquids up to 2 hours before you are scheduled to arrive for your surgery. Do not drink anything within 2 hours of your scheduled arrival time.  Clear liquids include: - water  - apple juice without pulp - gatorade (not RED colors) - black coffee or tea (Do NOT add milk or creamers to the coffee or tea) Do NOT drink anything that is not on this list.  In addition, your doctor has ordered for you to drink the provided:  Ensure Pre-Surgery Clear Carbohydrate Drink  Drinking this carbohydrate drink up to two hours before surgery helps to reduce insulin resistance and improve patient outcomes. Please complete drinking 2 hours before scheduled arrival time.  One week prior to surgery: starting February 27 Stop aspirin and Anti-inflammatories (NSAIDS) such as Advil, Aleve, Ibuprofen, Motrin, Naproxen, Naprosyn and Aspirin based products such as Excedrin, Goody's Powder, BC Powder. Stop ANY OVER THE COUNTER supplements until after surgery. You may however, continue to take Tylenol if needed for pain up until the day of surgery.  Continue taking all prescribed medications  TAKE ONLY THESE MEDICATIONS THE MORNING OF SURGERY WITH A SIP OF WATER:  Esomeprazole (nexium) - (take one the night before and one on the morning of  surgery - helps to prevent nausea after surgery.)  No Alcohol for 24 hours before or after surgery.  No Smoking including e-cigarettes for 24 hours before surgery.  No chewable tobacco products for at least 6 hours before surgery.  No nicotine patches on the day of surgery.  Do not use any "recreational" drugs for at least a week (preferably 2 weeks) before your surgery.  Please be advised that the combination of cocaine and anesthesia may have negative outcomes, up to and including death. If you test positive for cocaine, your surgery will be cancelled.  On the morning of surgery brush your teeth with toothpaste and water, you may rinse your mouth with mouthwash if you wish. Do not swallow any toothpaste or mouthwash.  Use CHG Soap as directed on instruction sheet.  Do not wear jewelry, make-up, hairpins, clips or nail polish.  Do not wear lotions, powders, or perfumes.   Do not shave body hair from the neck down 48 hours before surgery.  Contact lenses, hearing aids and dentures may not be worn into surgery.  Do not bring valuables to the hospital. [facility] is not responsible for any missing/lost belongings or valuables.   Notify your doctor if there is any change in your medical condition (cold, fever, infection).  Wear comfortable clothing (specific to your surgery type) to the hospital.  After surgery, you can help prevent lung complications by doing breathing exercises.  Take deep breaths and cough every 1-2 hours. Your doctor may order a device called an Chiropodist to  help you take deep breaths.  If you are being admitted to the hospital overnight, leave your suitcase in the car. After surgery it may be brought to your room.  In case of increased patient census, it may be necessary for you, the patient, to continue your postoperative care in the Same Day Surgery department.  If you are being discharged the day of surgery, you will not be allowed to drive  home. You will need a responsible individual to drive you home and stay with you for 24 hours after surgery.   If you are taking public transportation, you will need to have a responsible individual with you.  Please call the [place] Dept. at [phone] if you have any questions about these instructions.  Surgery Visitation Policy:  Patients undergoing a surgery or procedure may have two family members or support persons with them as long as the person is not COVID-19 positive or experiencing its symptoms.   Inpatient Visitation:    Visiting hours are 7 a.m. to 8 p.m. Up to four visitors are allowed at one time in a patient room. The visitors may rotate out with other people during the day. One designated support person (adult) may remain overnight.  Due to an increase in RSV and influenza rates and associated hospitalizations, children ages 49 and under will not be able to visit patients in [facility]. Masks continue to be strongly recommended.     Preparing for Surgery with CHLORHEXIDINE GLUCONATE (CHG) Soap  Chlorhexidine Gluconate (CHG) Soap  o An antiseptic cleaner that kills germs and bonds with the skin to continue killing germs even after washing  o Used for showering the night before surgery and morning of surgery  Before surgery, you can play an important role by reducing the number of germs on your skin.  CHG (Chlorhexidine gluconate) soap is an antiseptic cleanser which kills germs and bonds with the skin to continue killing germs even after washing.  Please do not use if you have an allergy to CHG or antibacterial soaps. If your skin becomes reddened/irritated stop using the CHG.  1. Shower the NIGHT BEFORE SURGERY and the MORNING OF SURGERY with CHG soap.  2. If you choose to wash your hair, wash your hair first as usual with your normal shampoo.  3. After shampooing, rinse your hair and body thoroughly to remove the shampoo.  4.  Use CHG as you would any other liquid soap. You can apply CHG directly to the skin and wash gently with a scrungie or a clean washcloth.  5. Apply the CHG soap to your body only from the neck down. Do not use on open wounds or open sores. Avoid contact with your eyes, ears, mouth, and genitals (private parts). Wash face and genitals (private parts) with your normal soap.  6. Wash thoroughly, paying special attention to the area where your surgery will be performed.  7. Thoroughly rinse your body with warm water.  8. Do not shower/wash with your normal soap after using and rinsing off the CHG soap.  9. Pat yourself dry with a clean towel.  10. Wear clean pajamas to bed the night before surgery.  12. Place clean sheets on your bed the night of your first shower and do not sleep with pets.  13. Shower again with the CHG soap on the day of surgery prior to arriving at the hospital.  14. Do not apply any deodorants/lotions/powders.  15. Please wear clean clothes to the  hospital.  Preoperative Educational Videos for Total Hip, Knee and Shoulder Replacements  To better prepare for surgery, please view our videos that explain the physical activity and discharge planning required to have the best surgical recovery at [facility].  [email or web address]  Questions? Call [phone] or email [email or web address]

## 2022-04-17 ENCOUNTER — Ambulatory Visit
Admission: RE | Admit: 2022-04-17 | Discharge: 2022-04-18 | Disposition: A | Discharge status: Home / Self Care | Payer: Medicare Other | Attending: Surgery | Admitting: Surgery

## 2022-04-17 ENCOUNTER — Ambulatory Visit: Payer: Medicare Other | Admitting: Anesthesiology

## 2022-04-17 ENCOUNTER — Ambulatory Visit: Payer: Medicare Other

## 2022-04-17 ENCOUNTER — Other Ambulatory Visit: Payer: Self-pay

## 2022-04-17 ENCOUNTER — Ambulatory Visit: Payer: Medicare Other | Admitting: Urgent Care

## 2022-04-17 ENCOUNTER — Encounter
Admission: RE | Disposition: A | Discharge status: Home / Self Care | Payer: Self-pay | Source: Home / Self Care | Attending: Surgery

## 2022-04-17 ENCOUNTER — Encounter: Payer: Self-pay | Admitting: Surgery

## 2022-04-17 DIAGNOSIS — Z01818 Encounter for other preprocedural examination: Secondary | ICD-10-CM

## 2022-04-17 DIAGNOSIS — Z96659 Presence of unspecified artificial knee joint: Secondary | ICD-10-CM

## 2022-04-17 DIAGNOSIS — M1731 Unilateral post-traumatic osteoarthritis, right knee: Secondary | ICD-10-CM | POA: Insufficient documentation

## 2022-04-17 DIAGNOSIS — Z01812 Encounter for preprocedural laboratory examination: Secondary | ICD-10-CM

## 2022-04-17 DIAGNOSIS — K219 Gastro-esophageal reflux disease without esophagitis: Secondary | ICD-10-CM | POA: Diagnosis not present

## 2022-04-17 DIAGNOSIS — Z96651 Presence of right artificial knee joint: Secondary | ICD-10-CM

## 2022-04-17 DIAGNOSIS — E119 Type 2 diabetes mellitus without complications: Secondary | ICD-10-CM | POA: Diagnosis not present

## 2022-04-17 DIAGNOSIS — I10 Essential (primary) hypertension: Secondary | ICD-10-CM | POA: Diagnosis not present

## 2022-04-17 HISTORY — PX: STERIOD INJECTION: SHX5046

## 2022-04-17 HISTORY — PX: INJECTION KNEE: SHX2446

## 2022-04-17 HISTORY — PX: TOTAL KNEE ARTHROPLASTY: SHX125

## 2022-04-17 LAB — ABO/RH: ABO/RH(D): A POS

## 2022-04-17 LAB — GLUCOSE, CAPILLARY
Glucose-Capillary: 113 mg/dL — ABNORMAL HIGH (ref 70–99)
Glucose-Capillary: 165 mg/dL — ABNORMAL HIGH (ref 70–99)
Glucose-Capillary: 200 mg/dL — ABNORMAL HIGH (ref 70–99)
Glucose-Capillary: 189 mg/dL — ABNORMAL HIGH (ref 70–99)

## 2022-04-17 SURGERY — ARTHROPLASTY, KNEE, TOTAL
Anesthesia: General | Site: Wrist | Laterality: Right

## 2022-04-17 MED ORDER — BUPIVACAINE LIPOSOME 1.3 % IJ SUSP
INTRAMUSCULAR | Status: AC
  Filled 2022-04-17: qty 20

## 2022-04-17 MED ORDER — TRIAMCINOLONE ACETONIDE 40 MG/ML IJ SUSP
INTRAMUSCULAR | Status: DC | PRN
  Administered 2022-04-17: 2 mL via INTRAMUSCULAR

## 2022-04-17 MED ORDER — SODIUM CHLORIDE 0.9 % BOLUS PEDS
250.0000 mL | Freq: Once | INTRAVENOUS | Status: AC
  Administered 2022-04-17: 250 mL via INTRAVENOUS

## 2022-04-17 MED ORDER — LOSARTAN POTASSIUM-HCTZ 50-12.5 MG PO TABS: 1.0000 | ORAL_TABLET | Freq: Two times a day (BID) | ORAL | Status: DC

## 2022-04-17 MED ORDER — METOCLOPRAMIDE HCL 10 MG PO TABS: 5.0000 mg | ORAL_TABLET | Freq: Three times a day (TID) | ORAL | Status: DC | PRN

## 2022-04-17 MED ORDER — TRIAMCINOLONE ACETONIDE 40 MG/ML IJ SUSP
INTRAMUSCULAR | Status: DC | PRN
  Administered 2022-04-17: 92 mL

## 2022-04-17 MED ORDER — BUPIVACAINE HCL (PF) 0.5 % IJ SOLN
INTRAMUSCULAR | Status: AC
  Filled 2022-04-17: qty 30

## 2022-04-17 MED ORDER — PROPOFOL 10 MG/ML IV BOLUS
INTRAVENOUS | Status: DC | PRN
  Administered 2022-04-17: 150 mg via INTRAVENOUS

## 2022-04-17 MED ORDER — MAGNESIUM HYDROXIDE 400 MG/5ML PO SUSP: 30.0000 mL | Freq: Every day | ORAL | Status: DC | PRN

## 2022-04-17 MED ORDER — TRANEXAMIC ACID 1000 MG/10ML IV SOLN
INTRAVENOUS | Status: AC
  Filled 2022-04-17: qty 10

## 2022-04-17 MED ORDER — CEFAZOLIN SODIUM-DEXTROSE 2-4 GM/100ML-% IV SOLN
INTRAVENOUS | Status: AC
  Filled 2022-04-17: qty 100

## 2022-04-17 MED ORDER — SODIUM CHLORIDE FLUSH 0.9 % IV SOLN
INTRAVENOUS | Status: AC
  Filled 2022-04-17: qty 40

## 2022-04-17 MED ORDER — KETOROLAC TROMETHAMINE 30 MG/ML IJ SOLN
INTRAMUSCULAR | Status: AC
  Filled 2022-04-17: qty 1

## 2022-04-17 MED ORDER — ROCURONIUM BROMIDE 10 MG/ML (PF) SYRINGE
PREFILLED_SYRINGE | INTRAVENOUS | Status: AC
  Filled 2022-04-17: qty 10

## 2022-04-17 MED ORDER — TRAMADOL HCL 50 MG PO TABS
50.0000 mg | ORAL_TABLET | Freq: Four times a day (QID) | ORAL | Status: DC | PRN
  Administered 2022-04-17: 50 mg via ORAL

## 2022-04-17 MED ORDER — ONDANSETRON HCL 4 MG/2ML IJ SOLN
INTRAMUSCULAR | Status: DC | PRN
  Administered 2022-04-17: 4 mg via INTRAVENOUS

## 2022-04-17 MED ORDER — ONDANSETRON HCL 4 MG PO TABS: 4.0000 mg | ORAL_TABLET | Freq: Four times a day (QID) | ORAL | Status: DC | PRN

## 2022-04-17 MED ORDER — FENTANYL CITRATE (PF) 100 MCG/2ML IJ SOLN
INTRAMUSCULAR | Status: DC | PRN
  Administered 2022-04-17 (×2): 25 ug via INTRAVENOUS
  Administered 2022-04-17: 50 ug via INTRAVENOUS

## 2022-04-17 MED ORDER — MONTELUKAST SODIUM 10 MG PO TABS
10.0000 mg | ORAL_TABLET | Freq: Every day | ORAL | Status: DC
  Administered 2022-04-17 – 2022-04-18 (×2): 10 mg via ORAL
  Filled 2022-04-17 (×2): qty 1

## 2022-04-17 MED ORDER — SODIUM CHLORIDE 0.9 % IV SOLN: INTRAVENOUS | Status: DC

## 2022-04-17 MED ORDER — MIDAZOLAM HCL 2 MG/2ML IJ SOLN
INTRAMUSCULAR | Status: AC
  Filled 2022-04-17: qty 2

## 2022-04-17 MED ORDER — OXYCODONE HCL 5 MG/5ML PO SOLN: 5.0000 mg | Freq: Once | ORAL | Status: DC | PRN

## 2022-04-17 MED ORDER — TRIAMCINOLONE ACETONIDE 40 MG/ML IJ SUSP
INTRAMUSCULAR | Status: AC
  Filled 2022-04-17: qty 2

## 2022-04-17 MED ORDER — HYDROMORPHONE HCL 1 MG/ML IJ SOLN
0.5000 mg | INTRAMUSCULAR | Status: AC | PRN
  Administered 2022-04-17 (×2): 0.5 mg via INTRAVENOUS

## 2022-04-17 MED ORDER — TRAMADOL HCL 50 MG PO TABS
ORAL_TABLET | ORAL | Status: AC
  Administered 2022-04-17: 50 mg via ORAL
  Filled 2022-04-17: qty 1

## 2022-04-17 MED ORDER — EPINEPHRINE PF 1 MG/ML IJ SOLN
INTRAMUSCULAR | Status: AC
  Filled 2022-04-17: qty 1

## 2022-04-17 MED ORDER — HYDROMORPHONE HCL 1 MG/ML IJ SOLN
INTRAMUSCULAR | Status: AC
  Administered 2022-04-17: 0.5 mg via INTRAVENOUS
  Filled 2022-04-17: qty 1

## 2022-04-17 MED ORDER — OXYCODONE HCL 5 MG PO TABS
ORAL_TABLET | ORAL | Status: AC
  Filled 2022-04-17: qty 1

## 2022-04-17 MED ORDER — KETOROLAC TROMETHAMINE 15 MG/ML IJ SOLN: 15.0000 mg | Freq: Once | INTRAMUSCULAR | Status: AC

## 2022-04-17 MED ORDER — ONDANSETRON HCL 4 MG/2ML IJ SOLN: 4.0000 mg | Freq: Four times a day (QID) | INTRAMUSCULAR | Status: DC | PRN

## 2022-04-17 MED ORDER — METHOCARBAMOL 500 MG PO TABS
500.0000 mg | ORAL_TABLET | Freq: Three times a day (TID) | ORAL | Status: DC | PRN
  Administered 2022-04-17: 500 mg via ORAL

## 2022-04-17 MED ORDER — FLEET ENEMA 7-19 GM/118ML RE ENEM: 1.0000 | ENEMA | Freq: Once | RECTAL | Status: DC | PRN

## 2022-04-17 MED ORDER — KETOROLAC TROMETHAMINE 15 MG/ML IJ SOLN
INTRAMUSCULAR | Status: AC
  Administered 2022-04-17: 7.5 mg via INTRAVENOUS
  Filled 2022-04-17: qty 1

## 2022-04-17 MED ORDER — APIXABAN 2.5 MG PO TABS: 2.5000 mg | ORAL_TABLET | Freq: Two times a day (BID) | ORAL | 0 refills | Status: AC

## 2022-04-17 MED ORDER — DEXAMETHASONE SODIUM PHOSPHATE 10 MG/ML IJ SOLN
INTRAMUSCULAR | Status: DC | PRN
  Administered 2022-04-17: 5 mg via INTRAVENOUS

## 2022-04-17 MED ORDER — DEXMEDETOMIDINE HCL IN NACL 80 MCG/20ML IV SOLN
INTRAVENOUS | Status: DC | PRN
  Administered 2022-04-17 (×2): 8 ug via BUCCAL
  Administered 2022-04-17: 4 ug via BUCCAL

## 2022-04-17 MED ORDER — LOSARTAN POTASSIUM 50 MG PO TABS
50.0000 mg | ORAL_TABLET | Freq: Every day | ORAL | Status: DC
  Administered 2022-04-17: 50 mg via ORAL

## 2022-04-17 MED ORDER — LOSARTAN POTASSIUM 50 MG PO TABS
ORAL_TABLET | ORAL | Status: AC
  Filled 2022-04-17: qty 1

## 2022-04-17 MED ORDER — HYDROMORPHONE HCL 1 MG/ML IJ SOLN
INTRAMUSCULAR | Status: AC
  Filled 2022-04-17: qty 1

## 2022-04-17 MED ORDER — DEXAMETHASONE SODIUM PHOSPHATE 10 MG/ML IJ SOLN
INTRAMUSCULAR | Status: AC
  Filled 2022-04-17: qty 1

## 2022-04-17 MED ORDER — ACETAMINOPHEN 500 MG PO TABS
1000.0000 mg | ORAL_TABLET | Freq: Four times a day (QID) | ORAL | Status: AC
  Administered 2022-04-17: 1000 mg via ORAL

## 2022-04-17 MED ORDER — PROPOFOL 500 MG/50ML IV EMUL
INTRAVENOUS | Status: DC | PRN
  Administered 2022-04-17: 140 ug/kg/min via INTRAVENOUS

## 2022-04-17 MED ORDER — TRIAMCINOLONE ACETONIDE 40 MG/ML IJ SUSP
INTRAMUSCULAR | Status: DC | PRN
  Administered 2022-04-17: 9.5 mL via INTRAMUSCULAR

## 2022-04-17 MED ORDER — ONDANSETRON HCL 4 MG/2ML IJ SOLN
INTRAMUSCULAR | Status: AC
  Filled 2022-04-17: qty 2

## 2022-04-17 MED ORDER — FENTANYL CITRATE (PF) 100 MCG/2ML IJ SOLN
INTRAMUSCULAR | Status: AC
  Administered 2022-04-17: 25 ug via INTRAVENOUS
  Filled 2022-04-17: qty 2

## 2022-04-17 MED ORDER — PROPOFOL 10 MG/ML IV BOLUS
INTRAVENOUS | Status: AC
  Filled 2022-04-17: qty 20

## 2022-04-17 MED ORDER — METOCLOPRAMIDE HCL 5 MG/ML IJ SOLN: 5.0000 mg | Freq: Three times a day (TID) | INTRAMUSCULAR | Status: DC | PRN

## 2022-04-17 MED ORDER — CHLORHEXIDINE GLUCONATE 0.12 % MT SOLN
OROMUCOSAL | Status: AC
  Administered 2022-04-17: 15 mL via OROMUCOSAL
  Filled 2022-04-17: qty 15

## 2022-04-17 MED ORDER — FENTANYL CITRATE (PF) 100 MCG/2ML IJ SOLN
25.0000 ug | INTRAMUSCULAR | Status: DC | PRN
  Administered 2022-04-17 (×3): 25 ug via INTRAVENOUS

## 2022-04-17 MED ORDER — BUPIVACAINE HCL (PF) 0.5 % IJ SOLN
INTRAMUSCULAR | Status: AC
  Filled 2022-04-17: qty 10

## 2022-04-17 MED ORDER — FENTANYL CITRATE (PF) 100 MCG/2ML IJ SOLN
INTRAMUSCULAR | Status: AC
  Filled 2022-04-17: qty 2

## 2022-04-17 MED ORDER — ACETAMINOPHEN 10 MG/ML IV SOLN
INTRAVENOUS | Status: DC | PRN
  Administered 2022-04-17: 1000 mg via INTRAVENOUS

## 2022-04-17 MED ORDER — TRAMADOL HCL 50 MG PO TABS: 50.0000 mg | ORAL_TABLET | Freq: Four times a day (QID) | ORAL | 0 refills | Status: DC | PRN

## 2022-04-17 MED ORDER — ORAL CARE MOUTH RINSE: 15.0000 mL | OROMUCOSAL | Status: DC | PRN

## 2022-04-17 MED ORDER — DIPHENHYDRAMINE HCL 12.5 MG/5ML PO ELIX: 12.5000 mg | ORAL_SOLUTION | ORAL | Status: DC | PRN

## 2022-04-17 MED ORDER — SODIUM CHLORIDE 0.9 % BOLUS PEDS: 250.0000 mL | Freq: Once | INTRAVENOUS | Status: DC

## 2022-04-17 MED ORDER — ACETAMINOPHEN 500 MG PO TABS
ORAL_TABLET | ORAL | Status: AC
  Administered 2022-04-17: 1000 mg via ORAL
  Filled 2022-04-17: qty 2

## 2022-04-17 MED ORDER — OXYCODONE HCL 5 MG PO TABS: 5.0000 mg | ORAL_TABLET | Freq: Once | ORAL | Status: DC | PRN

## 2022-04-17 MED ORDER — ACETAMINOPHEN 10 MG/ML IV SOLN
INTRAVENOUS | Status: AC
  Filled 2022-04-17: qty 100

## 2022-04-17 MED ORDER — LIDOCAINE HCL (PF) 2 % IJ SOLN
INTRAMUSCULAR | Status: AC
  Filled 2022-04-17: qty 5

## 2022-04-17 MED ORDER — APIXABAN 2.5 MG PO TABS: 2.5000 mg | ORAL_TABLET | Freq: Two times a day (BID) | ORAL | Status: DC

## 2022-04-17 MED ORDER — HYDROCHLOROTHIAZIDE 25 MG PO TABS
ORAL_TABLET | ORAL | Status: AC
  Administered 2022-04-18: 12.5 mg via ORAL
  Filled 2022-04-17: qty 1

## 2022-04-17 MED ORDER — TRAMADOL HCL 50 MG PO TABS
ORAL_TABLET | ORAL | Status: AC
  Filled 2022-04-17: qty 1

## 2022-04-17 MED ORDER — METHOCARBAMOL 500 MG PO TABS
ORAL_TABLET | ORAL | Status: AC
  Filled 2022-04-17: qty 1

## 2022-04-17 MED ORDER — CEFAZOLIN SODIUM-DEXTROSE 2-4 GM/100ML-% IV SOLN
2.0000 g | Freq: Four times a day (QID) | INTRAVENOUS | Status: AC
  Administered 2022-04-17: 2 g via INTRAVENOUS

## 2022-04-17 MED ORDER — KETOROLAC TROMETHAMINE 15 MG/ML IJ SOLN
INTRAMUSCULAR | Status: AC
  Administered 2022-04-17: 15 mg via INTRAVENOUS
  Filled 2022-04-17: qty 1

## 2022-04-17 MED ORDER — BISACODYL 10 MG RE SUPP: 10.0000 mg | Freq: Every day | RECTAL | Status: DC | PRN

## 2022-04-17 MED ORDER — ORAL CARE MOUTH RINSE: 15.0000 mL | Freq: Once | OROMUCOSAL | Status: AC

## 2022-04-17 MED ORDER — KETOROLAC TROMETHAMINE 15 MG/ML IJ SOLN
INTRAMUSCULAR | Status: AC
  Filled 2022-04-17: qty 1

## 2022-04-17 MED ORDER — PROPOFOL 1000 MG/100ML IV EMUL
INTRAVENOUS | Status: AC
  Filled 2022-04-17: qty 100

## 2022-04-17 MED ORDER — CEFAZOLIN SODIUM-DEXTROSE 2-4 GM/100ML-% IV SOLN
INTRAVENOUS | Status: AC
  Administered 2022-04-17: 2 g via INTRAVENOUS
  Filled 2022-04-17: qty 100

## 2022-04-17 MED ORDER — DEXMEDETOMIDINE HCL IN NACL 80 MCG/20ML IV SOLN
INTRAVENOUS | Status: AC
  Filled 2022-04-17: qty 20

## 2022-04-17 MED ORDER — TRANEXAMIC ACID 1000 MG/10ML IV SOLN
INTRAVENOUS | Status: DC | PRN
  Administered 2022-04-17: 1000 mg via TOPICAL

## 2022-04-17 MED ORDER — PANTOPRAZOLE SODIUM 40 MG PO TBEC
40.0000 mg | DELAYED_RELEASE_TABLET | Freq: Every day | ORAL | Status: DC
  Administered 2022-04-17: 40 mg via ORAL

## 2022-04-17 MED ORDER — MIDAZOLAM HCL 2 MG/2ML IJ SOLN
INTRAMUSCULAR | Status: DC | PRN
  Administered 2022-04-17: 2 mg via INTRAVENOUS

## 2022-04-17 MED ORDER — ROCURONIUM BROMIDE 100 MG/10ML IV SOLN
INTRAVENOUS | Status: DC | PRN
  Administered 2022-04-17: 50 mg via INTRAVENOUS

## 2022-04-17 MED ORDER — OXYCODONE HCL 5 MG PO TABS
5.0000 mg | ORAL_TABLET | ORAL | Status: DC | PRN
  Administered 2022-04-17 – 2022-04-18 (×3): 5 mg via ORAL

## 2022-04-17 MED ORDER — DOCUSATE SODIUM 100 MG PO CAPS
100.0000 mg | ORAL_CAPSULE | Freq: Two times a day (BID) | ORAL | Status: DC
  Administered 2022-04-17: 100 mg via ORAL

## 2022-04-17 MED ORDER — CHLORHEXIDINE GLUCONATE 0.12 % MT SOLN: 15.0000 mL | Freq: Once | OROMUCOSAL | Status: AC

## 2022-04-17 MED ORDER — KETOROLAC TROMETHAMINE 15 MG/ML IJ SOLN
7.5000 mg | Freq: Four times a day (QID) | INTRAMUSCULAR | Status: AC
  Administered 2022-04-17: 7.5 mg via INTRAVENOUS

## 2022-04-17 MED ORDER — SODIUM CHLORIDE 0.9 % IR SOLN
Status: DC | PRN
  Administered 2022-04-17: 2000 mL
  Administered 2022-04-17: 1000 mL

## 2022-04-17 MED ORDER — HYDROCHLOROTHIAZIDE 12.5 MG PO TABS
12.5000 mg | ORAL_TABLET | Freq: Every day | ORAL | Status: DC
  Administered 2022-04-17: 12.5 mg via ORAL
  Filled 2022-04-17 (×2): qty 1

## 2022-04-17 MED ORDER — ACETAMINOPHEN 500 MG PO TABS
ORAL_TABLET | ORAL | Status: AC
  Filled 2022-04-17: qty 2

## 2022-04-17 MED ORDER — DOCUSATE SODIUM 100 MG PO CAPS
ORAL_CAPSULE | ORAL | Status: AC
  Administered 2022-04-17: 100 mg via ORAL
  Filled 2022-04-17: qty 1

## 2022-04-17 MED ORDER — LIDOCAINE HCL (CARDIAC) PF 100 MG/5ML IV SOSY
PREFILLED_SYRINGE | INTRAVENOUS | Status: DC | PRN
  Administered 2022-04-17: 80 mg via INTRAVENOUS

## 2022-04-17 MED ORDER — 0.9 % SODIUM CHLORIDE (POUR BTL) OPTIME
TOPICAL | Status: DC | PRN
  Administered 2022-04-17: 500 mL

## 2022-04-17 MED ORDER — DOCUSATE SODIUM 100 MG PO CAPS
ORAL_CAPSULE | ORAL | Status: AC
  Filled 2022-04-17: qty 1

## 2022-04-17 MED ORDER — PANTOPRAZOLE SODIUM 40 MG PO TBEC
DELAYED_RELEASE_TABLET | ORAL | Status: AC
  Filled 2022-04-17: qty 1

## 2022-04-17 MED ORDER — METHOCARBAMOL 500 MG PO TABS: 500.0000 mg | ORAL_TABLET | Freq: Three times a day (TID) | ORAL | Status: DC | PRN

## 2022-04-17 MED ORDER — CEFAZOLIN SODIUM-DEXTROSE 2-4 GM/100ML-% IV SOLN
2.0000 g | INTRAVENOUS | Status: AC
  Administered 2022-04-17: 2 g via INTRAVENOUS

## 2022-04-17 MED ORDER — ACETAMINOPHEN 325 MG PO TABS: 325.0000 mg | ORAL_TABLET | Freq: Four times a day (QID) | ORAL | Status: DC | PRN

## 2022-04-17 MED ORDER — SUGAMMADEX SODIUM 200 MG/2ML IV SOLN
INTRAVENOUS | Status: DC | PRN
  Administered 2022-04-17: 170 mg via INTRAVENOUS

## 2022-04-17 SURGICAL SUPPLY — 64 items
APL PRP STRL LF DISP 70% ISPRP (MISCELLANEOUS) ×6
BIT DRILL QUICK REL 1/8 2PK SL (DRILL) IMPLANT
BLADE SAW SAG 25X90X1.19 (BLADE) ×3 IMPLANT
BLADE SURG SZ20 CARB STEEL (BLADE) ×3 IMPLANT
BNDG CMPR STD VLCR NS LF 5.8X6 (GAUZE/BANDAGES/DRESSINGS) ×3
BNDG ELASTIC 6X5.8 VLCR NS LF (GAUZE/BANDAGES/DRESSINGS) ×3 IMPLANT
BNDG ELASTIC 6X5.8 VLCR STR LF (GAUZE/BANDAGES/DRESSINGS) IMPLANT
BRNG TIB 67X14 ANT STAB MDLR (Insert) ×3 IMPLANT
CEMENT BONE R 1X40 (Cement) ×6 IMPLANT
CEMENT VACUUM MIXING SYSTEM (MISCELLANEOUS) ×3 IMPLANT
CHLORAPREP W/TINT 26 (MISCELLANEOUS) ×3 IMPLANT
COOLER POLAR GLACIER W/PUMP (MISCELLANEOUS) ×3 IMPLANT
COVER MAYO STAND REUSABLE (DRAPES) ×3 IMPLANT
CUFF TOURN SGL QUICK 24 (TOURNIQUET CUFF)
CUFF TOURN SGL QUICK 34 (TOURNIQUET CUFF)
CUFF TRNQT CYL 24X4X16.5-23 (TOURNIQUET CUFF) IMPLANT
CUFF TRNQT CYL 34X4.125X (TOURNIQUET CUFF) IMPLANT
DRAPE 3/4 80X56 (DRAPES) ×3 IMPLANT
DRAPE IMP U-DRAPE 54X76 (DRAPES) ×3 IMPLANT
DRAPE U-SHAPE 47X51 STRL (DRAPES) ×3 IMPLANT
DRILL QUICK RELEASE 1/8 INCH (DRILL) ×9
DRSG MEPILEX SACRM 8.7X9.8 (GAUZE/BANDAGES/DRESSINGS) IMPLANT
DRSG OPSITE POSTOP 4X10 (GAUZE/BANDAGES/DRESSINGS) ×3 IMPLANT
DRSG OPSITE POSTOP 4X8 (GAUZE/BANDAGES/DRESSINGS) ×3 IMPLANT
ELECT REM PT RETURN 9FT ADLT (ELECTROSURGICAL) ×3
ELECTRODE REM PT RTRN 9FT ADLT (ELECTROSURGICAL) ×3 IMPLANT
GAUZE XEROFORM 1X8 LF (GAUZE/BANDAGES/DRESSINGS) ×3 IMPLANT
GLOVE BIO SURGEON STRL SZ7.5 (GLOVE) ×12 IMPLANT
GLOVE BIO SURGEON STRL SZ8 (GLOVE) ×12 IMPLANT
GLOVE BIOGEL PI IND STRL 8 (GLOVE) ×3 IMPLANT
GLOVE SURG UNDER LTX SZ8 (GLOVE) ×3 IMPLANT
GOWN STRL REUS W/ TWL LRG LVL3 (GOWN DISPOSABLE) ×3 IMPLANT
GOWN STRL REUS W/ TWL XL LVL3 (GOWN DISPOSABLE) ×3 IMPLANT
GOWN STRL REUS W/TWL LRG LVL3 (GOWN DISPOSABLE) ×3
GOWN STRL REUS W/TWL XL LVL3 (GOWN DISPOSABLE) ×3
HANDLE YANKAUER SUCT OPEN TIP (MISCELLANEOUS) ×3 IMPLANT
HOOD PEEL AWAY T7 (MISCELLANEOUS) ×9 IMPLANT
INSERT TIB BEARING 67X14 (Insert) IMPLANT
IV NS IRRIG 3000ML ARTHROMATIC (IV SOLUTION) ×3 IMPLANT
KIT TURNOVER KIT A (KITS) ×3 IMPLANT
KNEE CR FEMORAL RT 65MM (Femur) IMPLANT
MANIFOLD NEPTUNE II (INSTRUMENTS) ×3 IMPLANT
NDL SPNL 20GX3.5 QUINCKE YW (NEEDLE) ×3 IMPLANT
NEEDLE SPNL 20GX3.5 QUINCKE YW (NEEDLE) ×3 IMPLANT
NS IRRIG 1000ML POUR BTL (IV SOLUTION) ×3 IMPLANT
PACK TOTAL KNEE (MISCELLANEOUS) ×3 IMPLANT
PAD WRAPON POLAR KNEE (MISCELLANEOUS) ×3 IMPLANT
PATELLA SERIES A (Orthopedic Implant) IMPLANT
PENCIL SMOKE EVACUATOR (MISCELLANEOUS) ×3 IMPLANT
PLATE INTERLOK 6700 (Plate) IMPLANT
PULSAVAC PLUS IRRIG FAN TIP (DISPOSABLE) ×3
STAPLER SKIN PROX 35W (STAPLE) ×3 IMPLANT
SUCTION FRAZIER HANDLE 10FR (MISCELLANEOUS) ×3
SUCTION TUBE FRAZIER 10FR DISP (MISCELLANEOUS) ×3 IMPLANT
SUT VIC AB 0 CT1 36 (SUTURE) ×9 IMPLANT
SUT VIC AB 2-0 CT1 27 (SUTURE) ×9
SUT VIC AB 2-0 CT1 TAPERPNT 27 (SUTURE) ×9 IMPLANT
SYR 10ML LL (SYRINGE) ×3 IMPLANT
SYR 20ML LL LF (SYRINGE) ×3 IMPLANT
SYR 30ML LL (SYRINGE) IMPLANT
TIP FAN IRRIG PULSAVAC PLUS (DISPOSABLE) ×3 IMPLANT
TRAP FLUID SMOKE EVACUATOR (MISCELLANEOUS) ×6 IMPLANT
WATER STERILE IRR 500ML POUR (IV SOLUTION) ×3 IMPLANT
WRAPON POLAR PAD KNEE (MISCELLANEOUS) ×3

## 2022-04-17 NOTE — Transfer of Care (Signed)
Immediate Anesthesia Transfer of Care Note  Patient: Janice Hunt  Procedure(s) Performed: TOTAL KNEE ARTHROPLASTY (Right: Knee) KNEE INJECTION (Left: Knee) STEROID INJECTION (Right: Wrist)  Patient Location: PACU  Anesthesia Type:General  Level of Consciousness: awake  Airway & Oxygen Therapy: Patient Spontanous Breathing and Patient connected to face mask oxygen  Post-op Assessment: Report given to RN and Post -op Vital signs reviewed and stable  Post vital signs: Reviewed and stable  Last Vitals:  Vitals Value Taken Time  BP 110/74 04/17/22 1025  Temp 36.3 C 04/17/22 1025  Pulse 60 04/17/22 1030  Resp 13 04/17/22 1030  SpO2 96 % 04/17/22 1030  Vitals shown include unvalidated device data.  Last Pain:  Vitals:   04/17/22 0618  TempSrc: Temporal  PainSc: 0-No pain         Complications: No notable events documented.

## 2022-04-17 NOTE — Progress Notes (Signed)
Patient is not able to walk the distance required to go the bathroom, or he/she is unable to safely negotiate stairs required to access the bathroom.  A 3in1 BSC will alleviate this problem  

## 2022-04-17 NOTE — Evaluation (Addendum)
Physical Therapy Evaluation Patient Details Name: Janice Hunt MRN: [id number] DOB: [birth year] Today's Date: 04/17/2022  History of Present Illness  Patient is a 72 year old female with degenerative joint disease of right knee, s/p right TKA. Also with acute DeQuervains tenosynovitis right wrist s/p steroid injection.  Clinical Impression  Patient is awake but groggy. She was agreeable to PT with encouragement. She reports being independent with mobility prior to surgery. She lives in a single story home with no steps to enter.  Patient performed long sitting independently in bed. She declined sitting on edge of bed due to feeling groggy and fatigued. Therapeutic exercises and range of motion initiated. Education provided for positioning instructions for right knee to promote knee range of motion. Recommend PT follow up in the morning to progress mobility in preparation for discharge home with family support.      Recommendations for follow up therapy are one component of a multi-disciplinary discharge planning process, led by the attending physician.  Recommendations may be updated based on patient status, additional functional criteria and insurance authorization.  Follow Up Recommendations Home health PT      Assistance Recommended at Discharge Set up Supervision/Assistance  Patient can return home with the following  Assist for transportation;Help with stairs or ramp for entrance    Equipment Recommendations Rolling walker (2 wheels)  Recommendations for Other Services       Functional Status Assessment Patient has had a recent decline in their functional status and demonstrates the ability to make significant improvements in function in a reasonable and predictable amount of time.     Precautions / Restrictions Precautions Precautions: Knee Precaution Booklet Issued: Yes (comment) Restrictions Weight Bearing Restrictions: Yes RLE Weight Bearing: Weight bearing as tolerated       Mobility  Bed Mobility Overal bed mobility: Needs Assistance Bed Mobility:  (supine to long sitting)           General bed mobility comments: patient perform long sitting independently x 3 bouts. patient declined sitting up on edge fo bed due to lethargy and is requesting to start out of bed mobility efforts tomorrow. no nausea or dizziness with sitting upright    Transfers                        Ambulation/Gait                  Stairs            Wheelchair Mobility    Modified Rankin (Stroke Patients Only)       Balance                                             Pertinent Vitals/Pain Pain Assessment Pain Assessment: Faces Faces Pain Scale: Hurts even more Pain Location: R knee with movement Pain Descriptors / Indicators: Sore Pain Intervention(s): Limited activity within patient's tolerance, Monitored during session, Premedicated before session, Repositioned (polar care in place)    [place] expects to be discharged to:: Private residence Living Arrangements: Spouse/significant other Available Help at Discharge: Family Type of Home: House Home Access: Level entry       Home Layout: One level Home Equipment: Air cabin crew (4 wheels)      Prior Function Prior Level of Function : Independent/Modified Independent;Working/employed  Hand Dominance        Extremity/Trunk Assessment   Upper Extremity Assessment Upper Extremity Assessment: Overall WFL for tasks assessed (fair right grip strength)    Lower Extremity Assessment Lower Extremity Assessment: RLE deficits/detail (LLE WNL) RLE Deficits / Details: SLR independently. ankle AROM WFL RLE Sensation: WNL       Communication   Communication: No difficulties  Cognition Arousal/Alertness: Suspect due to medications (awake with periods of lethargy) Behavior During Therapy: WFL for tasks  assessed/performed Overall Cognitive Status: Within Functional Limits for tasks assessed                                          General Comments General comments (skin integrity, edema, etc.): patient educated on postioning techniques to promote increased ROM, including avoiding pillows behind the knee    Exercises Total Joint Exercises Ankle Circles/Pumps: AROM, Strengthening, Right, 5 reps, Supine Quad Sets: AROM, Strengthening, Right, 5 reps, Supine Heel Slides: AAROM, Strengthening, Right, 5 reps, Supine Straight Leg Raises: AROM, Strengthening, Right, 5 reps, Supine, AAROM Goniometric ROM: R knee 5-45 degrees, limited by pain with knee flexion Other Exercises Other Exercises: verbal and visual cues for exercise technique for strengthening   Assessment/Plan    PT Assessment Patient needs continued PT services  PT Problem List Decreased strength;Decreased range of motion;Decreased activity tolerance;Decreased mobility;Decreased balance;Decreased safety awareness;Decreased knowledge of use of DME;Pain       PT Treatment Interventions DME instruction;Stair training;Gait training;Functional mobility training;Therapeutic activities;Therapeutic exercise;Balance training;Neuromuscular re-education;Patient/family education    PT Goals (Current goals can be found in the Care Plan section)  Acute Rehab PT Goals Patient Stated Goal: to go home PT Goal Formulation: With patient Time For Goal Achievement: 05/01/22 Potential to Achieve Goals: Good    Frequency BID     Co-evaluation               AM-PAC PT "6 Clicks" Mobility  Outcome Measure Help needed turning from your back to your side while in a flat bed without using bedrails?: None Help needed moving from lying on your back to sitting on the side of a flat bed without using bedrails?: A Little Help needed moving to and from a bed to a chair (including a wheelchair)?: A Little Help needed standing up  from a chair using your arms (e.g., wheelchair or bedside chair)?: A Little Help needed to walk in hospital room?: A Little Help needed climbing 3-5 steps with a railing? : A Little 6 Click Score: 19    End of Session Equipment Utilized During Treatment: Oxygen Activity Tolerance: Patient limited by fatigue;Patient limited by pain;Patient limited by lethargy Patient left: in bed;with call bell/phone within reach;with family/visitor present (polar care in place) Nurse Communication: Mobility status PT Visit Diagnosis: Other abnormalities of gait and mobility (R26.89);Difficulty in walking, not elsewhere classified (R26.2)    Time: [id number] PT Time Calculation (min) (ACUTE ONLY): 29 min   Charges:   PT Evaluation $PT Eval Low Complexity: 1 Low PT Treatments $Therapeutic Exercise: 8-22 mins        Minna Merritts, PT, MPT   Percell Locus 04/17/2022, 3:28 PM

## 2022-04-17 NOTE — H&P (Signed)
History of Present Illness: Janice Hunt is a 72 y.o. who presents today for history and physical. She is scheduled to undergo a right total knee arthroplasty on 04/17/2022. Since her last visit here to clinic she has not seen any improvement in her condition and wishes to proceed with a right total knee arthroplasty.  Patient has a chronic history of right knee pathology and has been followed by Reche Dixon, PA-C but secondary to continuation of discomfort was referred to Dr. Roland Rack who evaluated the patient. She reports 4/10 pain. The pain is located along the anterior and medial aspect of the knee. The pain is described as aching, dull, stabbing, and throbbing. The symptoms are aggravated using stairs, at higher levels of activity, walking, standing, and standing pivot. She also describes mechanical symptoms. She has associated mild swelling, but no deformity. She has tried acetaminophen, over-the-counter medications, anti-inflammatories, bracing, and a home exercise program with temporary partial relief of her symptoms. The patient does recall a twisting injury to her right knee about 5 years ago. At the time, she was seen by another orthopedic surgeon in the area who treated her nonsurgically with bracing and icing. The patient gradually improved, but has noted intermittent episodes of her knee giving way ever since this incident.   On today's visit she is complaining of pain along the radial distribution of the right wrist for the last 2 to 3 weeks after she used a deadbolt that was tight and felt a pop to the wrist. Concerned that this is going to cause her issues. Postop rehab.  Past Medical History: Adult idiopathic generalized osteoporosis 02/25/2018  BD T score -3.1/lumbar, -2.0/forearm, unchanged, 1/20  Diabetes mellitus without complication (CMS-HCC)  Diverticulosis 03/23/2014 (sigmoid colon)  Pure hyperglyceridemia   Past Surgical History: EGD 1981  COLONOSCOPY 07/17/2002  EGD  07/17/2002  COLONOSCOPY 10/10/2007  EGD 10/10/2007  COLONOSCOPY 03/23/2014 (Microscopic colitis/No dysplasia seen/Repeat 55yr/MUS)  HYSTERECTOMY (TAH with right oophorectomy)  TONSILLECTOMY & ADENOIDECTOMY   Past Family History: Supraventricular tachycardia Mother  Diabetes Mother  Coronary Artery Disease  Father  Myocardial Infarction (Heart attack) Father  Stroke Father  Diabetes Brother  Celiac disease Neg Hx  Colon cancer Neg Hx  Colon polyps Neg Hx  Liver disease Neg Hx  Rectal cancer Neg Hx  Ulcers Neg Hx   Medications: aspirin 81 MG EC tablet Take 81 mg by mouth once daily.  esomeprazole (NEXIUM) 20 MG DR capsule Take 20 mg by mouth once daily.  losartan-hydroCHLOROthiazide (HYZAAR) 50-12.5 mg tablet Take 1 tablet by mouth 2 (two) times daily 60 tablet 11  montelukast (SINGULAIR) 10 mg tablet TAKE ONE TABLET BY MOUTH DAILY 90 tablet 0   Allergies: Codeine Other (Makes her feel "spaced out" and "very loopy")  Ibuprofen (Headache, Dry mouth, weird taste) Clarithromycin (Severe abdominal pain)   Review of Systems A comprehensive 14 point ROS was performed, reviewed, and the pertinent orthopaedic findings are documented in the HPI.  Physical Exam: BP 110/72 (BP Location: Left upper arm, Patient Position: Sitting, BP Cuff Size: Large Adult)  Ht 154.9 cm ('5\' 1"'$ )  Wt 83.7 kg (184 lb 9.6 oz)  BMI 34.88 kg/m   General: Well-developed well-nourished female seen in no acute distress.   HEENT: Atraumatic,normocephalic. Pupils are equal and reactive to light. Oropharynx is clear with moist mucosa  Lungs: Clear to auscultation bilaterally   Cardiovascular: Regular rate and rhythm. Normal S1, S2. No murmurs. No appreciable gallops or rubs. Peripheral pulses are palpable.  Abdomen: Soft,  non-tender, nondistended. Bowel sounds present  Right knee exam: GAIT: mild limp and uses no assistive devices. ALIGNMENT: Minimal varus SKIN: unremarkable SWELLING:  minimal EFFUSION: trace WARMTH: no warmth TENDERNESS: Mild-moderate over the medial joint line, but no lateral joint line tenderness ROM: 0 to 125 degrees with pain in maximal flexion McMURRAY'S: positive PATELLOFEMORAL: normal tracking with no peri-patellar tenderness and negative apprehension sign CREPITUS: no LACHMAN'S: 1+ PIVOT SHIFT: trace positive ANTERIOR DRAWER: 1+ POSTERIOR DRAWER: negative VARUS/VALGUS: stable  Neurological: The patient is alert and oriented Sensation to light touch appears to be intact and within normal limits Gross motor strength appeared to be equal to 5/5  Vascular : Peripheral pulses felt to be palpable. Capillary refill appears to be intact and within normal limits  Imaging: X-rays taken in [place] clinic of the right knee showed moderate degenerative changes primarily involving the medial compartment with approximately 40% medial joint space narrowing. She was noted to have mild varus deformity. There is no fractures or lytic lesions noted. Patella appears to be tracking well.  Knee Imaging, external: Right knee: A recent MRI scan of the right knee is available for review and has been reviewed by myself. By report, the study demonstrates evidence of "complex degenerative tearing the posterior horn and body of the medial meniscus with substance loss of the posterior horn the medial extrusion. Tear extends in close proximity to the posterior root. Nondisplaced horizontal tear of the anterior horn lateral meniscus. Tricompartmental osteoarthritis, most severe in the medial compartment with areas of high-grade and full-thickness cartilage loss and underlying subchondral bone marrow edema." There also is evidence of a "moderate-sized joint effusion" as well as a "moderate-sized Baker's cyst".  Impression: 1. Posttraumatic osteoarthritis right knee. 2. Degenerative joint disease left knee. 3. DeQuervains tenosynovitis right wrist.  Plan:  The treatment  options were discussed with the patient. In addition, patient educational materials were provided regarding the diagnosis and treatment options. The patient is quite frustrated by her symptoms and function limitations, and is ready to consider more aggressive treatment options. Therefore, I have recommended a surgical procedure, specifically a right total knee arthroplasty. The procedure was discussed with the patient, as were the potential risks (including bleeding, infection, nerve and/or blood vessel injury, persistent or recurrent pain, stiffness of the knee, loosening of and/or failure of the components, need for further surgery, blood clots, strokes, heart attacks and/or arhythmias, pneumonia, etc.) and benefits. The patient states her understanding and wishes to proceed. All of the patient's questions and concerns were answered. She can call any time with further concerns. She will return to work without restrictions. She will follow up post-surgery, routine.  H&P reviewed and patient re-examined. The patient also notes increased left knee pain and request a steroid injection to be administered while she is under anesthesia for her right knee. Finally, the patient notes a 3-week history of increased radial sided right wrist pain following a twisting injury while trying to loosen a bolt at home. Her history and examination are consistent with acute DeQuervains tenosynovitis that has not responded to use of a thumb spica splint. Therefore, she is offered excepted a steroid injection into the first dorsal compartment which also will be done while she is under anesthesia.

## 2022-04-17 NOTE — Anesthesia Procedure Notes (Signed)
Procedure Name: Intubation Date/Time: 04/17/2022 8:03 AM  Performed by: Cammie Sickle, CRNAPre-anesthesia Checklist: Patient identified, Patient being monitored, Timeout performed, Emergency Drugs available and Suction available Patient Re-evaluated:Patient Re-evaluated prior to induction Oxygen Delivery Method: Circle system utilized Preoxygenation: Pre-oxygenation with 100% oxygen Induction Type: IV induction Ventilation: Mask ventilation without difficulty Laryngoscope Size: 3 and McGraph Grade View: Grade I Tube type: Oral Tube size: 6.0 mm Number of attempts: 1 Airway Equipment and Method: Stylet Placement Confirmation: ETT inserted through vocal cords under direct vision, positive ETCO2 and breath sounds checked- equal and bilateral Secured at: 21 cm Tube secured with: Tape Dental Injury: Teeth and Oropharynx as per pre-operative assessment  Comments: Smooth atraumatic intubation, no complications noted

## 2022-04-17 NOTE — Anesthesia Preprocedure Evaluation (Addendum)
Anesthesia Evaluation  Patient identified by MRN, date of birth, ID band Patient awake    Reviewed: Allergy & Precautions, NPO status , Patient's Chart, lab work & pertinent test results  History of Anesthesia Complications Negative for: history of anesthetic complications  Airway Mallampati: III  TM Distance: <3 FB Neck ROM: full    Dental  (+) Chipped   Pulmonary neg shortness of breath, asthma    Pulmonary exam normal        Cardiovascular Exercise Tolerance: Good hypertension, Normal cardiovascular exam     Neuro/Psych negative neurological ROS  negative psych ROS   GI/Hepatic Neg liver ROS,GERD  Controlled,,  Endo/Other  diabetes, Type 2    Renal/GU      Musculoskeletal   Abdominal   Peds  Hematology negative hematology ROS (+)   Anesthesia Other Findings Past Medical History: No date: Adult idiopathic generalized osteoporosis No date: Asthma No date: Cancer ([place])     Comment:  skin No date: Diverticulosis No date: DM type 2 with diabetic mixed hyperlipidemia (HCC) No date: GERD (gastroesophageal reflux disease) No date: Hypertension No date: Pneumonia No date: Pure hyperglyceridemia No date: Severe obesity (BMI 35.0-39.9) with comorbidity ([place])  Past Surgical History: No date: ABDOMINAL HYSTERECTOMY     Comment:  with right oophorectomy 1980: BREAST BIOPSY; Bilateral No date: COLONOSCOPY     Comment:  2004, 2009, 2016 No date: ESOPHAGOGASTRODUODENOSCOPY     Comment:  1981, 2004, 2009 No date: KNEE ARTHROSCOPY; Left No date: TONSILLECTOMY AND ADENOIDECTOMY  BMI    Body Mass Index: 34.58 kg/m      Reproductive/Obstetrics negative OB ROS                             Anesthesia Physical Anesthesia Plan  ASA: 3  Anesthesia Plan: General ETT   Post-op Pain Management:    Induction: Intravenous  PONV Risk Score and Plan: Ondansetron, Dexamethasone, Midazolam  and Treatment may vary due to age or medical condition  Airway Management Planned: Oral ETT  Additional Equipment:   Intra-op Plan:   Post-operative Plan: Extubation in OR  Informed Consent: I have reviewed the patients History and Physical, chart, labs and discussed the procedure including the risks, benefits and alternatives for the proposed anesthesia with the patient or authorized representative who has indicated his/her understanding and acceptance.     Dental Advisory Given  Plan Discussed with: Anesthesiologist, CRNA and Surgeon  Anesthesia Plan Comments: (Patient reports baseline hoarseness 2/2 COVID in the past  Patient initially was consented for spinal with backup GA.  Before any pre medication was given ss we were about to transport her to the OR she changed her mind and would like [place] not place a spinal and is now requesting GA. That she had many concerns about spinal anaesthetic even after our discussion.  Thorough discussion about risk and benefits for spinal vs GA.  Patient consented for risks of anesthesia including but not limited to:  - adverse reactions to medications - damage to eyes, teeth, lips or other oral mucosa - nerve damage due to positioning  - sore throat or hoarseness - Damage to heart, brain, nerves, lungs, other parts of body or loss of life  Patient voiced understanding.)       Anesthesia Quick Evaluation

## 2022-04-17 NOTE — Op Note (Addendum)
04/17/2022  10:35 AM  Patient:   Janice Hunt  Pre-Op Diagnosis:   1. Degenerative joint disease, right knee.  2. Degenerative joint disease, left knee.  3. DeQuervain's tenosynovitis, right wrist.  Post-Op Diagnosis:   Same  Procedure:   1. Right TKA using all-cemented [employer] Vanguard system with a 65 mm PCR femur, a 67 mm tibial tray with a 14 mm anterior stabilized E-poly insert, and a 31 x 8 mm all-poly 3-pegged domed patella.  2. Steroid injection left knee.  3. Steroid injection right 1st dorsal compartment.  Surgeon:   Pascal Lux, MD  Assistant:   Benjaman Lobe, RNFA  Anesthesia:   GET  Findings:   As above  Complications:   None  EBL:   20 cc  Fluids:   600 cc crystalloid  UOP:   None  TT:   90 minutes at 300 mmHg  Drains:   None  Closure:   Staples  Implants:   As above  Brief Clinical Note:   The patient is a 72 year old female with a history of progressively worsening right knee pain. The patient's symptoms have progressed despite medications, activity modification, injections, etc. The patient's history and examination were consistent with a complex medial meniscus tear and underlying degenerative joint disease of the right knee. These findings were confirmed by plain radiographs and preoperative MRI scanning. The patient presents at this time for a right total knee arthroplasty.  Procedure:   The patient was brought into the operating room and lain in the supine position. After adequate general endotracheal intubation and anesthesia were obtained, a time out was performed to identify first the left knee and then the right wrist.  The left knee was injected sterilely using a solution of 1.5 cc of Kenalog-40 (60 mg) and 8 cc of 0.5% Sensorcaine.  Next, the first dorsal compartment of the right wrist was injected sterilely using a solution of 0.5 cc of Kenalog-40 (20 mg) and 2 cc of 0.5% Sensorcaine.   The primary procedure was then started. The right lower  extremity was prepped with ChloraPrep solution and draped sterilely. Preoperative antibiotics were administered. A second timeout was performed to verify the appropriate surgical site before the limb was exsanguinated with an Esmarch and the tourniquet inflated to 300 mmHg.   A standard anterior approach to the knee was made through an approximately 6-7 inch incision. The incision was carried down through the subcutaneous tissues to expose superficial retinaculum. This was split the length of the incision and the medial flap elevated sufficiently to expose the medial retinaculum. The medial retinaculum was incised, leaving a 3-4 mm cuff of tissue on the patella. This was extended distally along the medial border of the patellar tendon and proximally through the medial third of the quadriceps tendon. A subtotal fat pad excision was performed before the soft tissues were elevated off the anteromedial and anterolateral aspects of the proximal tibia to the level of the collateral ligaments. The anterior portions of the medial and lateral menisci were removed, as was the anterior cruciate ligament. With the knee flexed to 90, the external tibial guide was positioned and the appropriate proximal tibial cut made. This piece was taken to the back table where it was measured and found to be optimally replicated by a 67 mm component.  Attention was directed to the distal femur. The intramedullary canal was accessed through a 3/8" drill hole. The intramedullary guide was inserted and positioned in order to obtain a neutral flexion  gap. The intercondylar block was positioned with care taken to avoid notching the anterior cortex of the femur. The appropriate cut was made. Next, the distal cutting block was placed at 5 of valgus alignment. Using the 9 mm slot, the distal cut was made. The distal femur was measured and found to be optimally replicated by the 65 mm component. The 65 mm 4-in-1 cutting block was positioned and  first the posterior, then the posterior chamfer, the anterior chamfer, and finally the anterior cuts were made. At this point, the posterior portions medial and lateral menisci were removed. A trial reduction was performed using the appropriate femoral and tibial components with first the 10 mm, then the 12 mm, and finally the 14 mm insert. The 14 mm insert demonstrated excellent stability to varus and valgus stressing both in flexion and extension while permitting full extension. Patella tracking was assessed and found to be excellent. Therefore, the tibial guide position was marked on the proximal tibia. The patella thickness was measured and found to be 22 mm. Therefore, the appropriate cut was made. The patellar surface was measured and found to be optimally replicated by the 31 mm component. The three peg holes were drilled in place before the trial button was inserted. Patella tracking was assessed and found to be excellent, passing the "no thumb test". The lug holes were drilled into the distal femur before the trial component was removed, leaving only the tibial tray. The keel was then created using the appropriate tower, reamer, and punch.  The bony surfaces were prepared for cementing by irrigating them thoroughly with sterile saline solution via the jet lavage system. A bone plug was fashioned from some of the bone that had been removed previously and used to plug the distal femoral canal. In addition, a mixture of 20 cc of Exparel, 30 cc of 0.5% Sensorcaine, 2 cc of Kenalog 40 (80 mg), and 30 mg of Toradol diluted out to 90 cc with normal saline were injected into the postero-medial and postero-lateral aspects of the knee, the medial and lateral gutter regions, and the peri-incisional tissues to help with postoperative analgesia. Meanwhile, the cement was being mixed on the back table. When it was ready, the tibial tray was cemented in first. The excess cement was removed using [employer] fast streamer. Next,  the femoral component was impacted into place. Again, the excess cement was removed using [employer] fast streamer. The 14 mm trial insert was positioned and the knee brought into extension while the cement hardened. Finally, the patella was cemented into place and secured using the patellar clamp. Again, the excess cement was removed using [employer] fast streamer. Once the cement had hardened, the knee was placed through a range of motion with the findings as described above. Therefore, the trial insert was removed and, after verifying that no cement had been retained posteriorly, the permanent 14 mm anterior stabilized E-polyethylene insert was positioned and secured using the appropriate key locking mechanism. Again the knee was placed through a range of motion with the findings as described above.  The wound was copiously irrigated with sterile saline solution using the jet lavage system before the quadriceps tendon and retinacular layer were reapproximated using #0 Vicryl interrupted sutures. The superficial retinacular layer also was closed using a running #0 Vicryl suture. A total of 10 cc of transexemic acid (TXA) was injected intra-articularly before the subcutaneous tissues were closed in several layers using 2-0 Vicryl interrupted sutures. The skin was closed using staples. A sterile honeycomb dressing was  applied to the skin before the leg was wrapped with an Ace wrap to accommodate the Polar Care device. The patient was then awakened, extubated, and returned to the recovery room in satisfactory condition after tolerating the procedure well.

## 2022-04-17 NOTE — Anesthesia Postprocedure Evaluation (Signed)
Anesthesia Post Note  Patient: Janice Hunt  Procedure(s) Performed: TOTAL KNEE ARTHROPLASTY (Right: Knee) KNEE INJECTION (Left: Knee) STEROID INJECTION (Right: Wrist)  Patient location during evaluation: PACU Anesthesia Type: General Level of consciousness: awake and alert Pain management: pain level controlled Vital Signs Assessment: post-procedure vital signs reviewed and stable Respiratory status: spontaneous breathing, nonlabored ventilation, respiratory function stable and patient connected to nasal cannula oxygen Cardiovascular status: blood pressure returned to baseline and stable Postop Assessment: no apparent nausea or vomiting Anesthetic complications: no   No notable events documented.   Last Vitals:  Vitals:   04/17/22 1317 04/17/22 1326  BP:  (!) 115/98  Pulse:  62  Resp:  17  Temp: (!) 36.1 C 36.6 C  SpO2:      Last Pain:  Vitals:   04/17/22 1326  TempSrc: Oral  PainSc:                  Precious Haws Tajah Schreiner

## 2022-04-17 NOTE — TOC Progression Note (Signed)
Transition of Care [facility]) - Progression Note    Patient Details  Name: Janice Hunt MRN: [id number] Date of Birth: 10-12-1950  Transition of Care [facility]) CM/SW [place], RN Phone Number: 04/17/2022, 11:10 AM  Clinical Narrative:    Patient is set up with [facility] for [place], 3 in 1 and RW to be delivered to the bedside by [employer]   Expected Discharge Plan: [place] Barriers to Discharge: No Barriers Identified  Expected Discharge Plan and Services   Discharge Planning Services: CM Consult   Living arrangements for the past 2 months: Single Family Home Expected Discharge Date: 04/17/22                 DME Agency: [employer] Date DME Agency Contacted: 04/17/22 Time DME Agency Contacted: 1109 Representative spoke with at DME Agency: [place]: PT [place]: [place] Date [place]: 04/17/22 Time [place]: 1109 Representative spoke with at [place]: [place]   Social Determinants of Health ([place]) Interventions SDOH Screenings   Tobacco Use: Low Risk  (04/17/2022)    Readmission Risk Interventions     No data to display

## 2022-04-17 NOTE — Discharge Instructions (Addendum)
Orthopedic discharge instructions: May shower with intact OpSite dressing. Apply ice frequently to knee or use Polar Care. Start Eliquis 1 tablet (2.5 mg) twice daily on Wednesday, 04/18/2022, for 2 weeks, then take aspirin 325 mg twice daily for 4 weeks. Take pain medication as prescribed when needed.  May supplement with ES Tylenol if necessary. May weight-bear as tolerated on right leg - use walker for balance and support. Follow-up in 10-14 days or as scheduled.     Information for Discharge Teaching: EXPAREL (bupivacaine liposome injectable suspension)   Your surgeon or anesthesiologist gave you EXPAREL(bupivacaine) to help control your pain after surgery.  EXPAREL is a local anesthetic that provides pain relief by numbing the tissue around the surgical site. EXPAREL is designed to release pain medication over time and can control pain for up to 72 hours. Depending on how you respond to EXPAREL, you may require less pain medication during your recovery.  Possible side effects: Temporary loss of sensation or ability to move in the area where bupivacaine was injected. Nausea, vomiting, constipation Rarely, numbness and tingling in your mouth or lips, lightheadedness, or anxiety may occur. Call your doctor right away if you think you may be experiencing any of these sensations, or if you have other questions regarding possible side effects.  Follow all other discharge instructions given to you by your surgeon or nurse. Eat a healthy diet and drink plenty of water or other fluids.  If you return to the hospital for any reason within 96 hours following the administration of EXPAREL, it is important for health care providers to know that you have received this anesthetic. A teal colored band has been placed on your arm with the date, time and amount of EXPAREL you have received in order to alert and inform your health care providers. Please leave this armband in place for the full 96 hours  following administration, and then you may remove the band.        POLAR CARE INFORMATION  [email or web address]  How to use [place] Cold Therapy System?  YouTube   [email or web address]  OPERATING INSTRUCTIONS  Start the product With dry hands, connect the transformer to the electrical connection located on the top of the cooler. Next, plug the transformer into an appropriate electrical outlet. The unit will automatically start running at this point.  To stop the pump, disconnect electrical power.  Unplug to stop the product when not in use. Unplugging the Polar Care unit turns it off. Always unplug immediately after use. Never leave it plugged in while unattended. Remove pad.    FIRST ADD WATER TO FILL LINE, THEN ICE---Replace ice when existing ice is almost melted  1 Discuss Treatment with your [facility]ger Practitioner and Use Only as Prescribed 2 Apply Insulation Barrier & Cold Therapy Pad 3 Check for Moisture 4 Inspect Skin Regularly  Tips and Trouble Shooting Usage Tips 1. Use cubed or chunked ice for optimal performance. 2. It is recommended to drain the Pad between uses. To drain the pad, hold the Pad upright with the hose pointed toward the ground. Depress the black plunger and allow water to drain out. 3. You may disconnect the Pad from the unit without removing the pad from the affected area by depressing the silver tabs on the hose coupling and gently pulling the hoses apart. The Pad and unit will seal itself and will not leak. Note: Some dripping during release is normal. 4. DO NOT RUN PUMP WITHOUT WATER! The  pump in this unit is designed to run with water. Running the unit without water will cause permanent damage to the pump. 5. Unplug unit before removing lid.  TROUBLESHOOTING GUIDE Pump not running, Water not flowing to the pad, Pad is not getting cold 1. Make sure the transformer is plugged into the wall outlet. 2.  Confirm that the ice and water are filled to the indicated levels. 3. Make sure there are no kinks in the pad. 4. Gently pull on the blue tube to make sure the tube/pad junction is straight. 5. Remove the pad from the treatment site and ll it while the pad is lying at; then reapply. 6. Confirm that the pad couplings are securely attached to the unit. Listen for the double clicks (Figure 1) to confirm the pad couplings are securely attached.  Leaks    Note: Some condensation on the lines, controller, and pads is unavoidable, especially in warmer climates. 1. If using a [place] Polar Care Cold Therapy unit with a detachable Cold Therapy Pad, and a leak exists (other than condensation on the lines) disconnect the pad couplings. Make sure the silver tabs on the couplings are depressed before reconnecting the pad to the pump hose; then confirm both sides of the coupling are properly clicked in. 2. If the coupling continues to leak or a leak is detected in the pad itself, stop using it and call [place] at [phone].  Cleaning After use, empty and dry the unit with a soft cloth. Warm water and mild detergent may be used occasionally to clean the pump and tubes.  WARNING: The [place] can be cold enough to cause serious injury, including full skin necrosis. Follow these Operating Instructions, and carefully read the Product Insert (see pouch on side of unit) and the Cold Therapy Pad Fitting Instructions (provided with each Cold Therapy Pad) prior to use.       AMBULATORY SURGERY  DISCHARGE INSTRUCTIONS  The drugs that you were given will stay in your system until tomorrow so for the next 24 hours you should not:  Drive an automobile Make any legal decisions Drink any alcoholic beverage  You may resume regular meals tomorrow.  Today it is better to start with liquids and gradually work up to solid foods.  You may eat anything you prefer, but it is better to start with  liquids, then soup and crackers, and gradually work up to solid foods.  Please notify your doctor immediately if you have any unusual bleeding, trouble breathing, redness and pain at the surgery site, drainage, fever, or pain not relieved by medication.  Additional Instructions:  Please contact your physician with any problems or Same Day Surgery at [phone], Monday through Friday 6 am to 4 pm, or [place] at [facility] number at [phone].

## 2022-04-18 ENCOUNTER — Encounter: Payer: Self-pay | Admitting: Surgery

## 2022-04-18 DIAGNOSIS — M1731 Unilateral post-traumatic osteoarthritis, right knee: Secondary | ICD-10-CM | POA: Diagnosis not present

## 2022-04-18 LAB — GLUCOSE, CAPILLARY: Glucose-Capillary: 146 mg/dL — ABNORMAL HIGH (ref 70–99)

## 2022-04-18 MED ORDER — ACETAMINOPHEN 500 MG PO TABS: 1000.0000 mg | ORAL_TABLET | Freq: Four times a day (QID) | ORAL | 0 refills | Status: AC

## 2022-04-18 MED ORDER — OXYCODONE HCL 5 MG PO TABS
ORAL_TABLET | ORAL | Status: AC
  Administered 2022-04-18: 5 mg via ORAL
  Filled 2022-04-18: qty 1

## 2022-04-18 MED ORDER — PANTOPRAZOLE SODIUM 40 MG PO TBEC
DELAYED_RELEASE_TABLET | ORAL | Status: AC
  Administered 2022-04-18: 40 mg via ORAL
  Filled 2022-04-18: qty 1

## 2022-04-18 MED ORDER — ACETAMINOPHEN 500 MG PO TABS
ORAL_TABLET | ORAL | Status: AC
  Administered 2022-04-18: 1000 mg via ORAL
  Filled 2022-04-18: qty 2

## 2022-04-18 MED ORDER — DOCUSATE SODIUM 100 MG PO CAPS
ORAL_CAPSULE | ORAL | Status: AC
  Administered 2022-04-18: 100 mg via ORAL
  Filled 2022-04-18: qty 1

## 2022-04-18 MED ORDER — KETOROLAC TROMETHAMINE 15 MG/ML IJ SOLN
INTRAMUSCULAR | Status: AC
  Administered 2022-04-18: 7.5 mg via INTRAVENOUS
  Filled 2022-04-18: qty 1

## 2022-04-18 MED ORDER — CEFAZOLIN SODIUM-DEXTROSE 2-4 GM/100ML-% IV SOLN
INTRAVENOUS | Status: AC
  Administered 2022-04-18: 2 g via INTRAVENOUS
  Filled 2022-04-18: qty 100

## 2022-04-18 MED ORDER — OXYCODONE HCL 5 MG PO TABS
ORAL_TABLET | ORAL | Status: AC
  Filled 2022-04-18: qty 1

## 2022-04-18 MED ORDER — HYDROCHLOROTHIAZIDE 25 MG PO TABS
ORAL_TABLET | ORAL | Status: AC
  Filled 2022-04-18: qty 1

## 2022-04-18 MED ORDER — LOSARTAN POTASSIUM 50 MG PO TABS
ORAL_TABLET | ORAL | Status: AC
  Administered 2022-04-18: 50 mg via ORAL
  Filled 2022-04-18: qty 1

## 2022-04-18 MED ORDER — OXYCODONE HCL 5 MG PO TABS: 2.5000 mg | ORAL_TABLET | ORAL | 0 refills | Status: AC | PRN

## 2022-04-18 MED ORDER — METHOCARBAMOL 500 MG PO TABS: 500.0000 mg | ORAL_TABLET | Freq: Three times a day (TID) | ORAL | 0 refills | Status: AC | PRN

## 2022-04-18 MED ORDER — APIXABAN 2.5 MG PO TABS
ORAL_TABLET | ORAL | Status: AC
  Administered 2022-04-18: 2.5 mg via ORAL
  Filled 2022-04-18: qty 1

## 2022-04-18 MED ORDER — ONDANSETRON HCL 4 MG PO TABS: 4.0000 mg | ORAL_TABLET | Freq: Four times a day (QID) | ORAL | 0 refills | Status: AC | PRN

## 2022-04-18 NOTE — Progress Notes (Signed)
Patient meets all requirements to go home. Discharge teaching performed with patient and husband Janice Hunt. All questions answered to satisfaction. Pain medication given to patient before leaving upon request and documented. Patient has DME that was delivered to her room.

## 2022-04-18 NOTE — Progress Notes (Signed)
Physical Therapy Treatment Patient Details Name: Janice Hunt MRN: [id number] DOB: Jun 26, 1950 Today's Date: 04/18/2022   History of Present Illness Patient is a 72 year old female with degenerative joint disease of right knee, s/p right TKA. Also with acute DeQuervains tenosynovitis right wrist s/p steroid injection.    PT Comments    Pt tolerated 2nd/BID session well and is cleared form an acute PT standpoint for safe DC home. Will need BSC + RW prior to DC. Recommend continued HHPT at DC to maximize her independence while assisting pt to PLOF.   Recommendations for follow up therapy are one component of a multi-disciplinary discharge planning process, led by the attending physician.  Recommendations may be updated based on patient status, additional functional criteria and insurance authorization.  Follow Up Recommendations  Home health PT     Assistance Recommended at Discharge Set up Supervision/Assistance  Patient can return home with the following A little help with walking and/or transfers;A little help with bathing/dressing/bathroom;Assistance with cooking/housework;Help with stairs or ramp for entrance;Assist for transportation   Equipment Recommendations  Rolling walker (2 wheels);BSC/3in1       Precautions / Restrictions Precautions Precautions: Knee Precaution Booklet Issued: Yes (comment) Restrictions Weight Bearing Restrictions: Yes RLE Weight Bearing: Weight bearing as tolerated     Mobility  Bed Mobility Overal bed mobility: Needs Assistance Bed Mobility: Supine to Sit  Supine to sit: Min assist  General bed mobility comments: In recliner pre/post session    Transfers Overall transfer level: Needs assistance Equipment used: Rolling walker (2 wheels) Transfers: Sit to/from Stand Sit to Stand: Supervision  General transfer comment: Pt was able to safely stand from EOB and w/c without physical assistance.    Ambulation/Gait Ambulation/Gait assistance:  Supervision Gait Distance (Feet): 100 Feet Assistive device: Rolling walker (2 wheels) Gait Pattern/deviations: Step-to pattern, Antalgic Gait velocity: decreased    General Gait Details: Overall improved kinemtics with increased knee flexion noted this session versus earlier one   Stairs Stairs: Yes Stairs assistance: Supervision Stair Management: Two rails, Step to pattern, Forwards Number of Stairs: 4 General stair comments: Pt demonstrated safe ability to ascend/descend 4 stair with BUE support. no safety concerns with pt's stair performance        Cognition Arousal/Alertness: Awake/alert Behavior During Therapy: WFL for tasks assessed/performed Overall Cognitive Status: Within Functional Limits for tasks assessed    General Comments: Pt is A and O x 4. Agreeable to session and cooperative throughout.        Exercises Total Joint Exercises Goniometric ROM: 2-68    General Comments General comments (skin integrity, edema, etc.): Pt was encouraged to perform A/AAROM  to promote increased knee flexion.      Pertinent Vitals/Pain Pain Assessment Pain Assessment: 0-10 Pain Score: 4  Pain Location: R knee with movement Pain Descriptors / Indicators: Sore Pain Intervention(s): Limited activity within patient's tolerance, Monitored during session, Premedicated before session, Repositioned, Ice applied     PT Goals (current goals can now be found in the care plan section) Acute Rehab PT Goals Patient Stated Goal: to go home Progress towards PT goals: Progressing toward goals    Frequency    BID      PT Plan Current plan remains appropriate       AM-PAC PT "6 Clicks" Mobility   Outcome Measure  Help needed turning from your back to your side while in a flat bed without using bedrails?: None Help needed moving from lying on your back to sitting on  the side of a flat bed without using bedrails?: A Little Help needed moving to and from a bed to a chair  (including a wheelchair)?: A Little Help needed standing up from a chair using your arms (e.g., wheelchair or bedside chair)?: A Little Help needed to walk in hospital room?: A Little Help needed climbing 3-5 steps with a railing? : A Little 6 Click Score: 19    End of Session Equipment Utilized During Treatment: Gait belt Activity Tolerance: Patient tolerated treatment well Patient left: in chair;with call bell/phone within reach;with family/visitor present Nurse Communication: Mobility status PT Visit Diagnosis: Other abnormalities of gait and mobility (R26.89);Difficulty in walking, not elsewhere classified (R26.2)     Time: [id number] PT Time Calculation (min) (ACUTE ONLY): 24 min  Charges:  $Gait Training: 8-22 mins $Therapeutic Exercise: 8-22 mins $Therapeutic Activity: 8-22 mins                     Julaine Fusi PTA 04/18/22, 12:38 PM

## 2022-04-18 NOTE — Progress Notes (Signed)
Patient blood sugar reading of 146 this am is inaccurate. This reading belongs to another patient, and the monitor sent it to this chart.

## 2022-04-18 NOTE — Discharge Summary (Signed)
Physician Discharge Summary  Patient ID: Janice Hunt MRN: [id number] DOB/AGE: [birth year] 72 y.o.  Admit date: 04/17/2022 Discharge date: 04/18/2022  Admission Diagnoses:  S/P knee replacement [Z96.659] Status post total knee replacement using cement, right [Z96.651] Right knee degenerative joint disease  Discharge Diagnoses: Patient Active Problem List   Diagnosis Date Noted   S/P knee replacement 04/17/2022   Status post total knee replacement using cement, right 04/17/2022    Past Medical History:  Diagnosis Date   Adult idiopathic generalized osteoporosis    Asthma    Cancer ([place])    skin   Diverticulosis    DM type 2 with diabetic mixed hyperlipidemia (HCC)    GERD (gastroesophageal reflux disease)    Hypertension    Pneumonia    Pure hyperglyceridemia    Severe obesity (BMI 35.0-39.9) with comorbidity ([place])      Transfusion: None.   Consultants (if any):   Discharged Condition: Improved  Hospital Course: Janice Hunt is an 72 y.o. female who was admitted 04/17/2022 with a diagnosis of right knee degenerative joint disease and went to the operating room on 04/17/2022 and underwent the above named procedures.    Surgeries: Procedure(s): TOTAL KNEE ARTHROPLASTY KNEE INJECTION STEROID INJECTION on 04/17/2022 Patient tolerated the surgery well. Taken to PACU where she was stabilized and then transferred to the orthopedic floor.  Started on Eliqius 2.'5mg'$  every 12 hrs. Heels elevated on bed with rolled towels. No evidence of DVT. Negative Homan. Physical therapy started on day #1 for gait training and transfer. OT started day #1 for ADL and assisted devices.  Patient's IV was removed on POD1.  Implants: Right TKA using all-cemented [employer] Vanguard system with a 65 mm PCR femur, a 67 mm tibial tray with a 14 mm anterior stabilized E-poly insert, and a 31 x 8 mm all-poly 3-pegged domed patella.   She was given perioperative antibiotics:  Anti-infectives (From admission,  onward)    Start     Dose/Rate Route Frequency Ordered Stop   04/17/22 1400  ceFAZolin (ANCEF) IVPB 2g/100 mL premix        2 g 200 mL/hr over 30 Minutes Intravenous Every 6 hours 04/17/22 1103 04/18/22 0553   04/17/22 1335  ceFAZolin (ANCEF) 2-4 GM/100ML-% IVPB       Note to Pharmacy: Register, [facility] A: cabinet override      04/17/22 1335 04/17/22 1355   04/17/22 0629  ceFAZolin (ANCEF) 2-4 GM/100ML-% IVPB       Note to Pharmacy: [facility] C: cabinet override      04/17/22 0629 04/17/22 0809   04/17/22 0615  ceFAZolin (ANCEF) IVPB 2g/100 mL premix        2 g 200 mL/hr over 30 Minutes Intravenous On call to O.R. 04/17/22 [id number] 04/17/22 [id number]     .  She was given sequential compression devices, early ambulation, and Eliquis for DVT prophylaxis.  She benefited maximally from the hospital stay and there were no complications.    Recent vital signs:  Vitals:   04/18/22 0515 04/18/22 0934  BP: 123/60 (!) 105/51  Pulse: 70 60  Resp: 16 16  Temp: 98.7 F (37.1 C) 99.7 F (37.6 C)  SpO2: 97% 93%    Recent laboratory studies:  Lab Results  Component Value Date   HGB 13.9 03/12/2022   Lab Results  Component Value Date   WBC 7.0 03/12/2022   PLT 251 03/12/2022   No results found for: "INR" Lab Results  Component Value Date  NA 135 03/12/2022   K 3.8 03/12/2022   CL 99 03/12/2022   CO2 27 03/12/2022   BUN 22 03/12/2022   CREATININE 0.86 03/12/2022   GLUCOSE 115 (H) 03/12/2022    Discharge Medications:   Allergies as of 04/18/2022       Reactions   Codeine Other (See Comments)   Makes person very loopy    Ibuprofen Other (See Comments)   Headaches and weird taste in mouth   Biaxin [clarithromycin] Other (See Comments)   Severe abdominal pain        Medication List     STOP taking these medications    aspirin EC 81 MG tablet       TAKE these medications    acetaminophen 500 MG tablet Commonly known as: TYLENOL Take 2 tablets (1,000 mg total)  by mouth every 6 (six) hours.   apixaban 2.5 MG Tabs tablet Commonly known as: Eliquis Take 1 tablet (2.5 mg total) by mouth 2 (two) times daily.   esomeprazole 20 MG capsule Commonly known as: NEXIUM Take 20 mg by mouth daily.   losartan-hydrochlorothiazide 50-12.5 MG tablet Commonly known as: HYZAAR Take 1 tablet by mouth 2 (two) times daily.   methocarbamol 500 MG tablet Commonly known as: ROBAXIN Take 1 tablet (500 mg total) by mouth every 8 (eight) hours as needed (muscle cramps).   montelukast 10 MG tablet Commonly known as: SINGULAIR Take 10 mg by mouth daily.   ondansetron 4 MG tablet Commonly known as: ZOFRAN Take 1 tablet (4 mg total) by mouth every 6 (six) hours as needed for nausea.   oxyCODONE 5 MG immediate release tablet Commonly known as: Oxy IR/ROXICODONE Take 0.5-1 tablets (2.5-5 mg total) by mouth every 4 (four) hours as needed for severe pain.   phentermine 30 MG capsule Take 30 mg by mouth every other day.               Durable Medical Equipment  (From admission, onward)           Start     Ordered   04/17/22 1352  DME Bedside commode  Once       Question:  Patient needs a bedside commode to treat with the following condition  Answer:  Status post total knee replacement using cement, right   04/17/22 1351   04/17/22 1352  DME 3 n 1  Once        04/17/22 1351   04/17/22 1352  DME Walker rolling  Once       Question Answer Comment  Walker: With 5 Inch Wheels   Patient needs a walker to treat with the following condition Status post total knee replacement using cement, right      04/17/22 1351           Diagnostic Studies: DG Knee Right Port  Result Date: 04/17/2022 CLINICAL DATA:  Status post total knee replacement using cement, right EXAM: PORTABLE RIGHT KNEE - 1-2 VIEW COMPARISON:  MRI 01/31/2022. FINDINGS: Postsurgical changes of right total knee arthroplasty. Normal alignment. No evidence of loosening or fracture. Expected soft  tissue swelling, gas, and joint effusion. IMPRESSION: Postsurgical changes of right total knee arthroplasty. Normal alignment. No evidence of immediate hardware complication. Electronically Signed   By: Maurine Simmering M.D.   On: 04/17/2022 10:57    Disposition: Plan for discharge home this afternoon with HHPT.   Follow-up Information     Lattie Corns, PA-C Follow up on 05/02/2022.   Specialty:  Physician Assistant Why: Post Op follow up appt scheduled at 1:15 (please plan to arrive 15 mins early, for check in processes) Contact information: [place] [address] [phone]         Lily Lovings, PT Follow up on 05/02/2022.   Specialty: Physical Therapy Why: Physical Therapy appt scheduled at 2:30pm Contact information: [place] [address] [phone]                Signed: Judson Roch PA-C 04/18/2022, 11:56 AM

## 2022-04-18 NOTE — Progress Notes (Signed)
  Subjective: 1 Day Post-Op Procedure(s) (LRB): TOTAL KNEE ARTHROPLASTY (Right) KNEE INJECTION (Left) STEROID INJECTION (Right) Patient reports pain as mild.   Patient is well, and has had no acute complaints or problems Plan is to go Home after hospital stay. Negative for chest pain and shortness of breath Fever: no Gastrointestinal:Negative for nausea and vomiting  Objective: Vital signs in last 24 hours: Temp:  [97 F (36.1 C)-98.7 F (37.1 C)] 98.7 F (37.1 C) (03/06 0515) Pulse Rate:  [60-77] 70 (03/06 0515) Resp:  [10-20] 16 (03/06 0515) BP: (97-135)/(59-98) 123/60 (03/06 0515) SpO2:  [94 %-99 %] 97 % (03/06 0515)  Intake/Output from previous day:  Intake/Output Summary (Last 24 hours) at 04/18/2022 0753 Last data filed at 04/18/2022 0531 Gross per 24 hour  Intake 2359.27 ml  Output 20 ml  Net 2339.27 ml    Intake/Output this shift: No intake/output data recorded.  Labs: No results for input(s): "HGB" in the last 72 hours. No results for input(s): "WBC", "RBC", "HCT", "PLT" in the last 72 hours. No results for input(s): "NA", "K", "CL", "CO2", "BUN", "CREATININE", "GLUCOSE", "CALCIUM" in the last 72 hours. No results for input(s): "LABPT", "INR" in the last 72 hours.   EXAM General - Patient is Alert, Appropriate, and Oriented Extremity - ABD soft Neurovascular intact Dorsiflexion/Plantar flexion intact No cellulitis present Dressing/Incision - Honeycomb dressing intact, minimal bloody drainage. Motor Function - intact, moving foot and toes well on exam.  Negative Homan's to the right leg.  Past Medical History:  Diagnosis Date   Adult idiopathic generalized osteoporosis    Asthma    Cancer ([place])    skin   Diverticulosis    DM type 2 with diabetic mixed hyperlipidemia (HCC)    GERD (gastroesophageal reflux disease)    Hypertension    Pneumonia    Pure hyperglyceridemia    Severe obesity (BMI 35.0-39.9) with comorbidity (HCC)     Assessment/Plan: 1  Day Post-Op Procedure(s) (LRB): TOTAL KNEE ARTHROPLASTY (Right) KNEE INJECTION (Left) STEROID INJECTION (Right) Principal Problem:   S/P knee replacement Active Problems:   Status post total knee replacement using cement, right  Estimated body mass index is 34.58 kg/m as calculated from the following:   Height as of this encounter: '5\' 1"'$  (1.549 m).   Weight as of this encounter: 83 kg. Advance diet Up with therapy D/C IV fluids when tolerating po intake.  Vitals reviewed this AM. Up with therapy today, needs to clear stair training prior to discharge. Plan for discharge home this afternoon with HHPT.  DVT Prophylaxis - TED hose and Eliquis, SCDs Weight-Bearing as tolerated to right leg  J. Cameron Proud, PA-C [facility]opa[facility] 04/18/2022, 7:53 AM

## 2022-04-18 NOTE — Progress Notes (Signed)
Physical Therapy Treatment Patient Details Name: Janice Hunt MRN: [id number] DOB: 10-27-1950 Today's Date: 04/18/2022   History of Present Illness Patient is a 72 year old female with degenerative joint disease of right knee, s/p right TKA. Also with acute DeQuervains tenosynovitis right wrist s/p steroid injection.    PT Comments    Pt was long sitting in bed upon arrival. She is A and O x 4 and pre-medicated prior to surgery. She does endorse severe pain with any R knee flexion. Pt did however safely exit bed with min A, stand to RW with supervision only, and ambulate 120 ft with RW. Safely demonstrated ability to ascend/descend 4 stair with supervision only. Pt's spouse was present for session. Reviewd car transfers, proper positioning, polar care use, and what to expect from [facility]. Both pt and spouse state understanding however pt requested author return for a BID/2nd session prior to [place] home. Author will return prior to lunch and continue to follow per current POC. HHPT at [facility] remains most appropriate.     Recommendations for follow up therapy are one component of a multi-disciplinary discharge planning process, led by the attending physician.  Recommendations may be updated based on patient status, additional functional criteria and insurance authorization.  Follow Up Recommendations  Home health PT     Assistance Recommended at Discharge Set up Supervision/Assistance  Patient can return home with the following A little help with walking and/or transfers;A little help with bathing/dressing/bathroom;Assistance with cooking/housework;Help with stairs or ramp for entrance;Assist for transportation   Equipment Recommendations  Rolling walker (2 wheels);BSC/3in1       Precautions / Restrictions Precautions Precautions: Knee Precaution Booklet Issued: Yes (comment) Restrictions Weight Bearing Restrictions: Yes RLE Weight Bearing: Weight bearing as tolerated     Mobility  Bed  Mobility Overal bed mobility: Needs Assistance Bed Mobility: Supine to Sit  Supine to sit: Min assist   Transfers Overall transfer level: Needs assistance Equipment used: Rolling walker (2 wheels) Transfers: Sit to/from Stand Sit to Stand: Supervision  General transfer comment: Pt was able to safely stand from EOB and w/c without physical assistance.    Ambulation/Gait Ambulation/Gait assistance: Min guard Gait Distance (Feet): 120 Feet Assistive device: Rolling walker (2 wheels) Gait Pattern/deviations: Step-to pattern, Antalgic Gait velocity: decreased  General Gait Details: Pt demonstrated safe ability to ambulate 120 ft with RW. CGA for safety however no intervention or physical assistance required.   Stairs Stairs: Yes Stairs assistance: Supervision Stair Management: Two rails, Step to pattern, Forwards Number of Stairs: 4 General stair comments: Pt demonstrated safe ability to ascend/descend 4 stair with BUE support. no safety concerns with pt's stair performance         Cognition Arousal/Alertness: Awake/alert Behavior During Therapy: WFL for tasks assessed/performed Overall Cognitive Status: Within Functional Limits for tasks assessed    General Comments: Pt is A and O x 4. Agreeable to session and cooperative throughout.           General Comments General comments (skin integrity, edema, etc.): pt and pt's spouse educated on importance of ther ex, positioning, polar care use, car transfers, and [facility] PT expectations.      Pertinent Vitals/Pain Pain Assessment Pain Assessment: 0-10 Pain Score: 6  Pain Location: R knee with movement Pain Descriptors / Indicators: Sore Pain Intervention(s): Limited activity within patient's tolerance, Monitored during session, Premedicated before session, Repositioned, Ice applied     PT Goals (current goals can now be found in the care plan section) Acute  Rehab PT Goals Patient Stated Goal: to go home Progress towards PT  goals: Progressing toward goals    Frequency    BID      PT Plan Current plan remains appropriate       AM-PAC PT "6 Clicks" Mobility   Outcome Measure  Help needed turning from your back to your side while in a flat bed without using bedrails?: None Help needed moving from lying on your back to sitting on the side of a flat bed without using bedrails?: A Little Help needed moving to and from a bed to a chair (including a wheelchair)?: A Little Help needed standing up from a chair using your arms (e.g., wheelchair or bedside chair)?: A Little Help needed to walk in hospital room?: A Little Help needed climbing 3-5 steps with a railing? : A Little 6 Click Score: 19    End of Session Equipment Utilized During Treatment: Gait belt Activity Tolerance: Patient tolerated treatment well Patient left: in chair;with call bell/phone within reach;with family/visitor present Nurse Communication: Mobility status PT Visit Diagnosis: Other abnormalities of gait and mobility (R26.89);Difficulty in walking, not elsewhere classified (R26.2)     Time: [id number] PT Time Calculation (min) (ACUTE ONLY): 50 min  Charges:  $Gait Training: 23-37 mins $Therapeutic Activity: 8-22 mins                    Julaine Fusi PTA 04/18/22, 10:49 AM

## 2022-05-07 ENCOUNTER — Ambulatory Visit
Admission: RE | Admit: 2022-05-07 | Discharge: 2022-05-07 | Disposition: A | Discharge status: Home / Self Care | Payer: Medicare Other | Source: Ambulatory Visit | Attending: Surgery | Admitting: Surgery

## 2022-05-07 ENCOUNTER — Other Ambulatory Visit: Payer: Self-pay | Admitting: Surgery

## 2022-05-07 DIAGNOSIS — Z96651 Presence of right artificial knee joint: Secondary | ICD-10-CM

## 2022-05-07 DIAGNOSIS — M1731 Unilateral post-traumatic osteoarthritis, right knee: Secondary | ICD-10-CM | POA: Insufficient documentation

## 2023-03-14 ENCOUNTER — Other Ambulatory Visit: Payer: Medicare Other

## 2023-03-20 ENCOUNTER — Encounter: Admission: RE | Payer: Self-pay | Source: Home / Self Care

## 2023-03-20 ENCOUNTER — Ambulatory Visit: Admission: RE | Admit: 2023-03-20 | Payer: Medicare Other | Source: Home / Self Care | Admitting: Surgery

## 2023-03-20 SURGERY — RELEASE DORSAL COMPARTMENT (DEQUERVAIN)
Anesthesia: Choice | Site: Wrist | Laterality: Right

## 2023-09-19 ENCOUNTER — Other Ambulatory Visit: Payer: Self-pay | Admitting: Internal Medicine

## 2023-09-19 DIAGNOSIS — E782 Mixed hyperlipidemia: Secondary | ICD-10-CM

## 2023-09-19 DIAGNOSIS — Z0000 Encounter for general adult medical examination without abnormal findings: Secondary | ICD-10-CM

## 2023-09-24 ENCOUNTER — Ambulatory Visit
Admission: RE | Admit: 2023-09-24 | Discharge: 2023-09-24 | Disposition: A | Discharge status: Home / Self Care | Payer: Self-pay | Source: Ambulatory Visit | Attending: Internal Medicine | Admitting: Internal Medicine

## 2023-09-24 DIAGNOSIS — E782 Mixed hyperlipidemia: Secondary | ICD-10-CM | POA: Insufficient documentation

## 2023-09-24 DIAGNOSIS — Z0000 Encounter for general adult medical examination without abnormal findings: Secondary | ICD-10-CM | POA: Insufficient documentation

## 2023-10-30 ENCOUNTER — Other Ambulatory Visit: Payer: Self-pay | Admitting: Internal Medicine

## 2023-10-30 DIAGNOSIS — R55 Syncope and collapse: Secondary | ICD-10-CM

## 2023-10-30 DIAGNOSIS — R519 Headache, unspecified: Secondary | ICD-10-CM

## 2023-11-11 ENCOUNTER — Ambulatory Visit
